# Patient Record
Sex: Female | Born: 1940 | Race: Black or African American | Hispanic: No | Smoking: Former smoker
Health system: Southern US, Community
[De-identification: ages and names within clinical notes are randomized; demographics above are authoritative.]

## PROBLEM LIST (undated history)

## (undated) DIAGNOSIS — I119 Hypertensive heart disease without heart failure: Secondary | ICD-10-CM

## (undated) DIAGNOSIS — R109 Unspecified abdominal pain: Secondary | ICD-10-CM

## (undated) DIAGNOSIS — J301 Allergic rhinitis due to pollen: Secondary | ICD-10-CM

## (undated) DIAGNOSIS — K219 Gastro-esophageal reflux disease without esophagitis: Secondary | ICD-10-CM

## (undated) DIAGNOSIS — M4800 Spinal stenosis, site unspecified: Secondary | ICD-10-CM

## (undated) DIAGNOSIS — I10 Essential (primary) hypertension: Secondary | ICD-10-CM

## (undated) DIAGNOSIS — R51 Headache: Secondary | ICD-10-CM

## (undated) DIAGNOSIS — M779 Enthesopathy, unspecified: Secondary | ICD-10-CM

## (undated) DIAGNOSIS — Z201 Contact with and (suspected) exposure to tuberculosis: Secondary | ICD-10-CM

## (undated) DIAGNOSIS — J45909 Unspecified asthma, uncomplicated: Secondary | ICD-10-CM

## (undated) DIAGNOSIS — M7150 Other bursitis, not elsewhere classified, unspecified site: Secondary | ICD-10-CM

## (undated) DIAGNOSIS — G4730 Sleep apnea, unspecified: Secondary | ICD-10-CM

## (undated) DIAGNOSIS — F419 Anxiety disorder, unspecified: Secondary | ICD-10-CM

## (undated) DIAGNOSIS — M1990 Unspecified osteoarthritis, unspecified site: Secondary | ICD-10-CM

## (undated) DIAGNOSIS — R42 Dizziness and giddiness: Secondary | ICD-10-CM

## (undated) DIAGNOSIS — R159 Full incontinence of feces: Secondary | ICD-10-CM

## (undated) HISTORY — PX: GALLBLADDER SURGERY: SHX652

## (undated) HISTORY — DX: Unspecified abdominal pain: R10.9

## (undated) HISTORY — DX: Hypertensive heart disease without heart failure: I11.9

## (undated) HISTORY — DX: Essential (primary) hypertension: I10

## (undated) HISTORY — DX: Unspecified asthma, uncomplicated: J45.909

## (undated) HISTORY — DX: Enthesopathy, unspecified: M77.9

## (undated) HISTORY — PX: BACK SURGERY: SHX140

## (undated) HISTORY — DX: Full incontinence of feces: R15.9

## (undated) HISTORY — PX: BREAST BIOPSY: SHX20

## (undated) HISTORY — DX: Spinal stenosis, site unspecified: M48.00

## (undated) HISTORY — DX: Dizziness and giddiness: R42

## (undated) HISTORY — PX: CHOLECYSTECTOMY: SHX55

## (undated) HISTORY — DX: Allergic rhinitis due to pollen: J30.1

## (undated) HISTORY — DX: Other bursitis, not elsewhere classified, unspecified site: M71.50

## (undated) HISTORY — DX: Unspecified osteoarthritis, unspecified site: M19.90

## (undated) HISTORY — DX: Contact with and (suspected) exposure to tuberculosis: Z20.1

## (undated) HISTORY — DX: Headache: R51

---

## 1978-11-08 HISTORY — PX: BACK SURGERY: SHX140

## 1988-11-07 HISTORY — PX: GALLBLADDER SURGERY: SHX652

## 1997-05-14 ENCOUNTER — Ambulatory Visit (HOSPITAL_COMMUNITY): Admission: RE | Admit: 1997-05-14 | Discharge: 1997-05-14 | Payer: Self-pay | Admitting: Internal Medicine

## 1997-06-14 ENCOUNTER — Ambulatory Visit (HOSPITAL_COMMUNITY): Admission: RE | Admit: 1997-06-14 | Discharge: 1997-06-14 | Payer: Self-pay | Admitting: Internal Medicine

## 1997-06-15 ENCOUNTER — Ambulatory Visit (HOSPITAL_COMMUNITY): Admission: RE | Admit: 1997-06-15 | Discharge: 1997-06-15 | Payer: Self-pay | Admitting: Infectious Diseases

## 1998-02-12 ENCOUNTER — Ambulatory Visit (HOSPITAL_COMMUNITY): Admission: RE | Admit: 1998-02-12 | Discharge: 1998-02-12 | Payer: Self-pay | Admitting: Internal Medicine

## 1999-12-02 ENCOUNTER — Encounter: Payer: Self-pay | Admitting: Emergency Medicine

## 1999-12-02 ENCOUNTER — Inpatient Hospital Stay (HOSPITAL_COMMUNITY): Admission: EM | Admit: 1999-12-02 | Discharge: 1999-12-06 | Payer: Self-pay | Admitting: Emergency Medicine

## 1999-12-03 ENCOUNTER — Encounter: Payer: Self-pay | Admitting: Cardiovascular Disease

## 1999-12-04 ENCOUNTER — Encounter: Payer: Self-pay | Admitting: Internal Medicine

## 2000-08-06 ENCOUNTER — Ambulatory Visit (HOSPITAL_COMMUNITY): Admission: RE | Admit: 2000-08-06 | Discharge: 2000-08-06 | Payer: Self-pay | Admitting: Gastroenterology

## 2000-08-09 ENCOUNTER — Ambulatory Visit (HOSPITAL_COMMUNITY): Admission: RE | Admit: 2000-08-09 | Discharge: 2000-08-09 | Payer: Self-pay | Admitting: Gastroenterology

## 2000-08-09 ENCOUNTER — Encounter: Payer: Self-pay | Admitting: Gastroenterology

## 2000-09-23 ENCOUNTER — Encounter: Payer: Self-pay | Admitting: General Surgery

## 2000-09-30 ENCOUNTER — Encounter (INDEPENDENT_AMBULATORY_CARE_PROVIDER_SITE_OTHER): Payer: Self-pay | Admitting: *Deleted

## 2000-09-30 ENCOUNTER — Ambulatory Visit (HOSPITAL_COMMUNITY): Admission: RE | Admit: 2000-09-30 | Discharge: 2000-10-01 | Payer: Self-pay | Admitting: General Surgery

## 2001-11-23 ENCOUNTER — Encounter: Admission: RE | Admit: 2001-11-23 | Discharge: 2001-11-23 | Payer: Self-pay | Admitting: Internal Medicine

## 2001-11-23 ENCOUNTER — Encounter: Payer: Self-pay | Admitting: Internal Medicine

## 2003-02-05 ENCOUNTER — Other Ambulatory Visit: Admission: RE | Admit: 2003-02-05 | Discharge: 2003-02-05 | Payer: Self-pay | Admitting: Internal Medicine

## 2003-02-12 ENCOUNTER — Ambulatory Visit (HOSPITAL_COMMUNITY): Admission: RE | Admit: 2003-02-12 | Discharge: 2003-02-12 | Payer: Self-pay | Admitting: Internal Medicine

## 2003-02-16 ENCOUNTER — Ambulatory Visit (HOSPITAL_COMMUNITY): Admission: RE | Admit: 2003-02-16 | Discharge: 2003-02-16 | Payer: Self-pay | Admitting: Internal Medicine

## 2003-03-26 ENCOUNTER — Encounter: Admission: RE | Admit: 2003-03-26 | Discharge: 2003-03-26 | Payer: Self-pay | Admitting: Internal Medicine

## 2003-04-26 ENCOUNTER — Inpatient Hospital Stay (HOSPITAL_BASED_OUTPATIENT_CLINIC_OR_DEPARTMENT_OTHER): Admission: RE | Admit: 2003-04-26 | Discharge: 2003-04-26 | Payer: Self-pay | Admitting: Cardiology

## 2004-08-29 ENCOUNTER — Encounter (INDEPENDENT_AMBULATORY_CARE_PROVIDER_SITE_OTHER): Payer: Self-pay | Admitting: *Deleted

## 2004-08-29 ENCOUNTER — Ambulatory Visit (HOSPITAL_COMMUNITY): Admission: RE | Admit: 2004-08-29 | Discharge: 2004-08-29 | Payer: Self-pay | Admitting: Gastroenterology

## 2005-05-23 ENCOUNTER — Ambulatory Visit (HOSPITAL_BASED_OUTPATIENT_CLINIC_OR_DEPARTMENT_OTHER): Admission: RE | Admit: 2005-05-23 | Discharge: 2005-05-23 | Payer: Self-pay | Admitting: Internal Medicine

## 2005-05-31 ENCOUNTER — Ambulatory Visit: Payer: Self-pay | Admitting: Internal Medicine

## 2005-07-09 ENCOUNTER — Ambulatory Visit: Payer: Self-pay | Admitting: Internal Medicine

## 2005-08-13 ENCOUNTER — Ambulatory Visit: Payer: Self-pay | Admitting: Internal Medicine

## 2006-10-31 ENCOUNTER — Emergency Department (HOSPITAL_COMMUNITY): Admission: EM | Admit: 2006-10-31 | Discharge: 2006-10-31 | Payer: Self-pay | Admitting: Emergency Medicine

## 2007-01-26 ENCOUNTER — Ambulatory Visit (HOSPITAL_COMMUNITY): Admission: RE | Admit: 2007-01-26 | Discharge: 2007-01-26 | Payer: Self-pay | Admitting: Internal Medicine

## 2007-08-26 ENCOUNTER — Ambulatory Visit (HOSPITAL_COMMUNITY): Admission: RE | Admit: 2007-08-26 | Discharge: 2007-08-26 | Payer: Self-pay | Admitting: Internal Medicine

## 2008-01-27 ENCOUNTER — Ambulatory Visit (HOSPITAL_COMMUNITY): Admission: RE | Admit: 2008-01-27 | Discharge: 2008-01-27 | Payer: Self-pay | Admitting: Internal Medicine

## 2008-02-10 ENCOUNTER — Other Ambulatory Visit: Admission: RE | Admit: 2008-02-10 | Discharge: 2008-02-10 | Payer: Self-pay | Admitting: Internal Medicine

## 2008-12-24 ENCOUNTER — Inpatient Hospital Stay (HOSPITAL_COMMUNITY): Admission: EM | Admit: 2008-12-24 | Discharge: 2009-01-04 | Payer: Self-pay | Admitting: Emergency Medicine

## 2008-12-25 ENCOUNTER — Ambulatory Visit: Payer: Self-pay | Admitting: Vascular Surgery

## 2008-12-25 ENCOUNTER — Encounter: Payer: Self-pay | Admitting: Internal Medicine

## 2009-01-02 ENCOUNTER — Encounter: Payer: Self-pay | Admitting: Internal Medicine

## 2009-01-19 ENCOUNTER — Emergency Department (HOSPITAL_COMMUNITY): Admission: EM | Admit: 2009-01-19 | Discharge: 2009-01-19 | Payer: Self-pay | Admitting: Emergency Medicine

## 2009-01-21 ENCOUNTER — Encounter: Admission: RE | Admit: 2009-01-21 | Discharge: 2009-01-21 | Payer: Self-pay | Admitting: Emergency Medicine

## 2009-02-08 ENCOUNTER — Encounter (HOSPITAL_COMMUNITY): Admission: RE | Admit: 2009-02-08 | Discharge: 2009-03-07 | Payer: Self-pay | Admitting: Cardiology

## 2009-04-17 ENCOUNTER — Encounter: Admission: RE | Admit: 2009-04-17 | Discharge: 2009-06-26 | Payer: Self-pay | Admitting: Internal Medicine

## 2010-03-09 HISTORY — PX: CARDIAC CATHETERIZATION: SHX172

## 2010-06-12 LAB — BASIC METABOLIC PANEL WITH GFR
BUN: 4 mg/dL — ABNORMAL LOW (ref 6–23)
CO2: 31 meq/L (ref 19–32)
Calcium: 9.3 mg/dL (ref 8.4–10.5)
Chloride: 100 meq/L (ref 96–112)
Creatinine, Ser: 0.87 mg/dL (ref 0.4–1.2)
GFR calc non Af Amer: >60 mL/min
Glucose, Bld: 96 mg/dL (ref 70–99)
Potassium: 3.5 meq/L (ref 3.5–5.1)
Sodium: 138 meq/L (ref 135–145)

## 2010-06-12 LAB — URINALYSIS, ROUTINE W REFLEX MICROSCOPIC
Bilirubin Urine: NEGATIVE
Glucose, UA: NEGATIVE mg/dL
Hgb urine dipstick: NEGATIVE
Ketones, ur: NEGATIVE mg/dL
Nitrite: NEGATIVE
Protein, ur: NEGATIVE mg/dL
Specific Gravity, Urine: 1.005 (ref 1.005–1.030)
Urobilinogen, UA: 0.2 mg/dL (ref 0.0–1.0)
pH: 7.5 (ref 5.0–8.0)

## 2010-06-12 LAB — CBC
HCT: 35 % — ABNORMAL LOW (ref 36.0–46.0)
HCT: 35.2 % — ABNORMAL LOW (ref 36.0–46.0)
HCT: 39.4 % (ref 36.0–46.0)
HCT: 44.6 % (ref 36.0–46.0)
Hemoglobin: 11.8 g/dL — ABNORMAL LOW (ref 12.0–15.0)
Hemoglobin: 11.9 g/dL — ABNORMAL LOW (ref 12.0–15.0)
Hemoglobin: 13.4 g/dL (ref 12.0–15.0)
Hemoglobin: 15.2 g/dL — ABNORMAL HIGH (ref 12.0–15.0)
MCHC: 33.8 g/dL (ref 30.0–36.0)
MCHC: 33.8 g/dL (ref 30.0–36.0)
MCHC: 34 g/dL (ref 30.0–36.0)
MCHC: 34.1 g/dL (ref 30.0–36.0)
MCV: 95 fL (ref 78.0–100.0)
MCV: 95.7 fL (ref 78.0–100.0)
MCV: 96.1 fL (ref 78.0–100.0)
MCV: 96.3 fL (ref 78.0–100.0)
Platelets: 227 K/uL (ref 150–400)
Platelets: 244 10*3/uL (ref 150–400)
Platelets: 248 K/uL (ref 150–400)
Platelets: 280 10*3/uL (ref 150–400)
RBC: 3.64 MIL/uL — ABNORMAL LOW (ref 3.87–5.11)
RBC: 3.65 MIL/uL — ABNORMAL LOW (ref 3.87–5.11)
RBC: 4.12 MIL/uL (ref 3.87–5.11)
RBC: 4.7 MIL/uL (ref 3.87–5.11)
RDW: 13 % (ref 11.5–15.5)
RDW: 13 % (ref 11.5–15.5)
RDW: 13.1 % (ref 11.5–15.5)
RDW: 13.3 % (ref 11.5–15.5)
WBC: 10.4 K/uL (ref 4.0–10.5)
WBC: 6.9 K/uL (ref 4.0–10.5)
WBC: 7.2 10*3/uL (ref 4.0–10.5)
WBC: 7.7 10*3/uL (ref 4.0–10.5)
WBC: 8.6 10*3/uL (ref 4.0–10.5)

## 2010-06-12 LAB — COMPREHENSIVE METABOLIC PANEL
ALT: 10 U/L (ref 0–35)
ALT: 9 U/L (ref 0–35)
AST: 13 U/L (ref 0–37)
AST: 17 U/L (ref 0–37)
AST: 17 U/L (ref 0–37)
Albumin: 3.4 g/dL — ABNORMAL LOW (ref 3.5–5.2)
Albumin: 3.9 g/dL (ref 3.5–5.2)
Alkaline Phosphatase: 102 U/L (ref 39–117)
Alkaline Phosphatase: 105 U/L (ref 39–117)
Alkaline Phosphatase: 88 U/L (ref 39–117)
BUN: 5 mg/dL — ABNORMAL LOW (ref 6–23)
BUN: 8 mg/dL (ref 6–23)
CO2: 29 mEq/L (ref 19–32)
CO2: 30 mEq/L (ref 19–32)
CO2: 32 mEq/L (ref 19–32)
Calcium: 10.1 mg/dL (ref 8.4–10.5)
Calcium: 9 mg/dL (ref 8.4–10.5)
Chloride: 101 mEq/L (ref 96–112)
Chloride: 98 mEq/L (ref 96–112)
Chloride: 98 mEq/L (ref 96–112)
Creatinine, Ser: 0.8 mg/dL (ref 0.4–1.2)
GFR calc Af Amer: >60 mL/min (ref >60)
GFR calc Af Amer: >60 mL/min (ref >60)
GFR calc non Af Amer: >60 mL/min (ref >60)
GFR calc non Af Amer: >60 mL/min (ref >60)
Glucose, Bld: 102 mg/dL — ABNORMAL HIGH (ref 70–99)
Glucose, Bld: 98 mg/dL (ref 70–99)
Potassium: 3 mEq/L — ABNORMAL LOW (ref 3.5–5.1)
Potassium: 4.1 mEq/L (ref 3.5–5.1)
Sodium: 137 mEq/L (ref 135–145)
Sodium: 137 mEq/L (ref 135–145)
Total Bilirubin: 0.7 mg/dL (ref 0.3–1.2)
Total Bilirubin: 0.7 mg/dL (ref 0.3–1.2)
Total Bilirubin: 0.7 mg/dL (ref 0.3–1.2)
Total Protein: 7.3 g/dL (ref 6.0–8.3)

## 2010-06-12 LAB — BLOOD GAS, ARTERIAL
Acid-Base Excess: 0.5 mmol/L (ref 0.0–2.0)
Acid-Base Excess: 3.4 mmol/L — ABNORMAL HIGH (ref 0.0–2.0)
Bicarbonate: 32.2 mEq/L — ABNORMAL HIGH (ref 20.0–24.0)
Bicarbonate: 34.6 meq/L — ABNORMAL HIGH (ref 20.0–24.0)
Drawn by: 310571
FIO2: 1 %
O2 Saturation: 92.1 %
O2 Saturation: 93.1 %
Patient temperature: 98.6
TCO2: 33.8 mmol/L (ref 0–100)
pCO2 arterial: 120 mmHg (ref 35.0–45.0)
pH, Arterial: 7.086 — CL (ref 7.350–7.400)
pO2, Arterial: 72.1 mmHg — ABNORMAL LOW (ref 80.0–100.0)
pO2, Arterial: 82.6 mmHg (ref 80.0–100.0)

## 2010-06-12 LAB — DIFFERENTIAL
Basophils Absolute: 0 10*3/uL (ref 0.0–0.1)
Basophils Absolute: 0 K/uL (ref 0.0–0.1)
Basophils Relative: 0 % (ref 0–1)
Basophils Relative: 0 % (ref 0–1)
Eosinophils Absolute: 0.1 10*3/uL (ref 0.0–0.7)
Eosinophils Absolute: 0.2 K/uL (ref 0.0–0.7)
Eosinophils Relative: 2 % (ref 0–5)
Eosinophils Relative: 3 % (ref 0–5)
Lymphocytes Relative: 35 % (ref 12–46)
Lymphocytes Relative: 38 % (ref 12–46)
Lymphs Abs: 2.5 K/uL (ref 0.7–4.0)
Lymphs Abs: 3.2 10*3/uL (ref 0.7–4.0)
Monocytes Absolute: 0.5 10*3/uL (ref 0.1–1.0)
Monocytes Absolute: 0.9 K/uL (ref 0.1–1.0)
Monocytes Relative: 12 % (ref 3–12)
Monocytes Relative: 6 % (ref 3–12)
Neutro Abs: 3.6 K/uL (ref 1.7–7.7)
Neutro Abs: 4.7 10*3/uL (ref 1.7–7.7)
Neutrophils Relative %: 50 % (ref 43–77)
Neutrophils Relative %: 54 % (ref 43–77)

## 2010-06-12 LAB — URINE MICROSCOPIC-ADD ON

## 2010-06-12 LAB — COMPREHENSIVE METABOLIC PANEL WITH GFR
ALT: 10 U/L (ref 0–35)
ALT: 10 U/L (ref 0–35)
ALT: 13 U/L (ref 0–35)
ALT: 19 U/L (ref 0–35)
ALT: 9 U/L (ref 0–35)
AST: 13 U/L (ref 0–37)
AST: 16 U/L (ref 0–37)
AST: 20 U/L (ref 0–37)
AST: 28 U/L (ref 0–37)
AST: 44 U/L — ABNORMAL HIGH (ref 0–37)
Albumin: 2.8 g/dL — ABNORMAL LOW (ref 3.5–5.2)
Albumin: 2.8 g/dL — ABNORMAL LOW (ref 3.5–5.2)
Albumin: 2.9 g/dL — ABNORMAL LOW (ref 3.5–5.2)
Albumin: 3 g/dL — ABNORMAL LOW (ref 3.5–5.2)
Albumin: 3.1 g/dL — ABNORMAL LOW (ref 3.5–5.2)
Alkaline Phosphatase: 73 U/L (ref 39–117)
Alkaline Phosphatase: 78 U/L (ref 39–117)
Alkaline Phosphatase: 79 U/L (ref 39–117)
Alkaline Phosphatase: 83 U/L (ref 39–117)
Alkaline Phosphatase: 90 U/L (ref 39–117)
BUN: 2 mg/dL — ABNORMAL LOW (ref 6–23)
BUN: 4 mg/dL — ABNORMAL LOW (ref 6–23)
BUN: 4 mg/dL — ABNORMAL LOW (ref 6–23)
BUN: 5 mg/dL — ABNORMAL LOW (ref 6–23)
BUN: 5 mg/dL — ABNORMAL LOW (ref 6–23)
CO2: 31 meq/L (ref 19–32)
CO2: 32 meq/L (ref 19–32)
CO2: 32 meq/L (ref 19–32)
CO2: 33 meq/L — ABNORMAL HIGH (ref 19–32)
CO2: 33 meq/L — ABNORMAL HIGH (ref 19–32)
Calcium: 9 mg/dL (ref 8.4–10.5)
Calcium: 9 mg/dL (ref 8.4–10.5)
Calcium: 9.2 mg/dL (ref 8.4–10.5)
Calcium: 9.2 mg/dL (ref 8.4–10.5)
Calcium: 9.2 mg/dL (ref 8.4–10.5)
Chloride: 100 meq/L (ref 96–112)
Chloride: 95 meq/L — ABNORMAL LOW (ref 96–112)
Chloride: 98 meq/L (ref 96–112)
Chloride: 99 meq/L (ref 96–112)
Chloride: 99 meq/L (ref 96–112)
Creatinine, Ser: 0.53 mg/dL (ref 0.4–1.2)
Creatinine, Ser: 0.58 mg/dL (ref 0.4–1.2)
Creatinine, Ser: 0.65 mg/dL (ref 0.4–1.2)
Creatinine, Ser: 0.82 mg/dL (ref 0.4–1.2)
Creatinine, Ser: 0.85 mg/dL (ref 0.4–1.2)
GFR calc non Af Amer: >60 mL/min
GFR calc non Af Amer: >60 mL/min
GFR calc non Af Amer: >60 mL/min
GFR calc non Af Amer: >60 mL/min
GFR calc non Af Amer: >60 mL/min
Glucose, Bld: 104 mg/dL — ABNORMAL HIGH (ref 70–99)
Glucose, Bld: 118 mg/dL — ABNORMAL HIGH (ref 70–99)
Glucose, Bld: 122 mg/dL — ABNORMAL HIGH (ref 70–99)
Glucose, Bld: 132 mg/dL — ABNORMAL HIGH (ref 70–99)
Glucose, Bld: 93 mg/dL (ref 70–99)
Potassium: 3.8 meq/L (ref 3.5–5.1)
Potassium: 3.8 meq/L (ref 3.5–5.1)
Potassium: 3.8 meq/L (ref 3.5–5.1)
Potassium: 4.4 meq/L (ref 3.5–5.1)
Potassium: 4.4 meq/L (ref 3.5–5.1)
Sodium: 135 meq/L (ref 135–145)
Sodium: 136 meq/L (ref 135–145)
Sodium: 137 meq/L (ref 135–145)
Sodium: 137 meq/L (ref 135–145)
Sodium: 137 meq/L (ref 135–145)
Total Bilirubin: 0.6 mg/dL (ref 0.3–1.2)
Total Bilirubin: 0.7 mg/dL (ref 0.3–1.2)
Total Bilirubin: 0.8 mg/dL (ref 0.3–1.2)
Total Bilirubin: 0.8 mg/dL (ref 0.3–1.2)
Total Bilirubin: 0.9 mg/dL (ref 0.3–1.2)
Total Protein: 5.8 g/dL — ABNORMAL LOW (ref 6.0–8.3)
Total Protein: 5.8 g/dL — ABNORMAL LOW (ref 6.0–8.3)
Total Protein: 5.9 g/dL — ABNORMAL LOW (ref 6.0–8.3)
Total Protein: 5.9 g/dL — ABNORMAL LOW (ref 6.0–8.3)
Total Protein: 6.1 g/dL (ref 6.0–8.3)

## 2010-06-12 LAB — FECAL LACTOFERRIN, QUANT: Fecal Lactoferrin: POSITIVE

## 2010-06-12 LAB — ELECTROLYTE PANEL
CO2: 33 meq/L — ABNORMAL HIGH (ref 19–32)
Chloride: 98 meq/L (ref 96–112)
Potassium: 3.9 meq/L (ref 3.5–5.1)
Sodium: 137 meq/L (ref 135–145)

## 2010-06-12 LAB — CK TOTAL AND CKMB (NOT AT ARMC)
CK, MB: 1.2 ng/mL (ref 0.3–4.0)
CK, MB: 2.2 ng/mL (ref 0.3–4.0)
CK, MB: 2.2 ng/mL (ref 0.3–4.0)
CK, MB: 3.2 ng/mL (ref 0.3–4.0)
Relative Index: 1.5 (ref 0.0–2.5)
Relative Index: INVALID (ref 0.0–2.5)
Relative Index: INVALID (ref 0.0–2.5)
Relative Index: INVALID (ref 0.0–2.5)
Total CK: 215 U/L — ABNORMAL HIGH (ref 7–177)
Total CK: 70 U/L (ref 7–177)
Total CK: 91 U/L (ref 7–177)
Total CK: 92 U/L (ref 7–177)

## 2010-06-12 LAB — GLUCOSE, CAPILLARY: Glucose-Capillary: 187 mg/dL — ABNORMAL HIGH (ref 70–99)

## 2010-06-12 LAB — URINE CULTURE: Colony Count: 30000

## 2010-06-12 LAB — OVA AND PARASITE EXAMINATION: Ova and parasites: NONE SEEN

## 2010-06-12 LAB — D-DIMER, QUANTITATIVE

## 2010-06-12 LAB — TROPONIN I: Troponin I: 0.04 ng/mL (ref 0.00–0.06)

## 2010-06-12 LAB — CARDIAC PANEL(CRET KIN+CKTOT+MB+TROPI)
CK, MB: 1 ng/mL (ref 0.3–4.0)
CK, MB: 1.1 ng/mL (ref 0.3–4.0)
Relative Index: INVALID (ref 0.0–2.5)
Total CK: 35 U/L (ref 7–177)
Troponin I: 0.01 ng/mL (ref 0.00–0.06)

## 2010-06-12 LAB — HEMOCCULT GUIAC POC 1CARD (OFFICE): Fecal Occult Bld: NEGATIVE

## 2010-06-12 LAB — CK: Total CK: 84 U/L (ref 7–177)

## 2010-06-12 LAB — BRAIN NATRIURETIC PEPTIDE: Pro B Natriuretic peptide (BNP): <30 pg/mL (ref 0.0–100.0)

## 2010-06-12 LAB — AMYLASE
Amylase: 33 U/L (ref 27–131)
Amylase: 52 U/L (ref 27–131)

## 2010-06-12 LAB — STOOL CULTURE

## 2010-06-26 ENCOUNTER — Encounter: Payer: Self-pay | Admitting: Internal Medicine

## 2010-07-25 NOTE — Op Note (Signed)
NAME:  Charlotte Whitehead, Charlotte Whitehead                 ACCOUNT NO.:  [id number]   MEDICAL RECORD NO.:  [id number]          PATIENT TYPE:  AMB   LOCATION:  ENDO                         FACILITY:  [facility]   PHYSICIAN:  Anselmo Rod, M.D.  DATE OF BIRTH:  10-05-1940   DATE OF PROCEDURE:  08/29/2004  DATE OF DISCHARGE:                                 OPERATIVE REPORT   PROCEDURE:  Colonoscopy with multiple biopsies.   ENDOSCOPIST:  Charna Elizabeth, M.D.   INSTRUMENT USED:  Olympus video colonoscope.   INDICATIONS FOR PROCEDURE:  70 year old African American female undergoing  screening colonoscopy to rule out colonic polyps, masses, etc.   PREPROCEDURE PREPARATION:  Informed consent was obtained from the patient.  The patient was fasted for eight hours prior to the procedure and prepped  with a bottle of magnesium citrate and a gallon of GoLYTELY the night prior  to the procedure.  The risks and benefits of the procedure including a 10%  missed rate of cancer and polyps were discussed with the patient, as well.   PREPROCEDURE PHYSICAL:  Patient with stable vital signs.  Neck supple.  Chest clear to auscultation.  S1 and S2 regular.  Abdomen soft with normal  bowel sounds.   DESCRIPTION OF PROCEDURE:  The patient was placed in the left lateral  decubitus position, sedated with 100 mg of Demerol and 10 mg Versed in slow  incremental doses.  Once the patient was adequately sedated, maintained on  low flow oxygen and continuous cardiac monitoring, the Olympus video  colonoscope was advanced from the rectum to the cecum.  Multiple washes were  done as there was a significant amount of residual stool in the colon.  A  small sessile biopsy was biopsied in the rectosigmoid colon (cold biopsies x  2).  A few sigmoid diverticula were seen.  Three small sessile polyps were  biopsied from the rectosigmoid colon.  Retroflexion in the rectum revealed  no abnormalities.  The patient tolerated the procedure well  without  complications.   IMPRESSION:  1.  Three small sessile polyps biopsied from the rectosigmoid colon.  2.  One small sessile polyp biopsied from 80 cm.  3.  A few sigmoid diverticula.  4.  Some residual stool in the colon, multiple washes done.   RECOMMENDATIONS:  1.  Await pathology results.  2.  Continue a high fiber diet with liberal fluid intake.  3.  Repeat colonoscopy depending on pathology results.  4.  Avoid all nonsteroidals for now.  5.  Outpatient follow up as need arises in the future.       JNM/MEDQ  D:  08/29/2004  T:  08/29/2004  Job:  [id number]   cc:   Minerva Areola L. August Saucer, M.D.  [address] [address]  [place]  [place] [address]  Fax: [phone]

## 2010-07-25 NOTE — Discharge Summary (Signed)
[place]. [facility]  Patient:    Charlotte Whitehead, Charlotte Whitehead                        MRN: [id number] Adm. Date:  [id number] Disc. Date: [id number] Attending:  Gwenyth Bender                           Discharge Summary  FINAL DIAGNOSES: 1. Acute asthma, 493.00. 2. Chest pain, 786.59. 3. Hypokalemia, 276.8. 4. Acute sinusitis, 461.9. 5. Hypertension, 401.9. 6. History of tobacco use, V158.82.  OPERATIONS AND PROCEDURES:  Stress Cardiolite per Ricki Rodriguez, M.D.  HISTORY OF PRESENT ILLNESS:  This was the first recent [place]. [facility] admission for this 70 year old black female who presented to the office complaining of increasing shortness of breath with cough.  The patient had been feeling well up until four days prior to admission when she noted the onset of increasing sinus congestion with cough and postnasal drainage.  The patient gradually developed a cough productive of yellowish to brownish phlegm.  There had been intermittent reddish sputum production as well.  The patient took an old Allegra at home as well as an antibiotic without significant relief.  On the day of admission she developed increasing substernal pressure pain while at rest with mild shortness of breath, no diaphoresis, nausea or vomiting.  No arm symptoms.  The patients symptoms persisted and she came to the office for further evaluation and was admitted thereafter.  Past medical history and physical examination is per admission H&P.  HOSPITAL COURSE:  The patient was admitted fracture further evaluation of atypical chest pain and shortness of breath.  There was a real concern for possible underlying angina.  EKG in the office was notable for normal sinus rhythm with T-wave inversions in V1 through V3 which was new from a previous tracing.  The patient was admitted to telemetry.  She was placed on IV nitroglycerin and IV heparin.  She was also started on Tequin at 400 mg  IV followed by 400 mg p.o.  She was noted to have some bronchospasm as well and was given an inhaler for this with expectorants.  Over the first 24 hours, the patient did considerably better.  Her enzymes were negative for signs of ischemia.  She was seen in consultation by Dr. Algie Coffer.  It was felt that her symptoms were suggestive and cardiac evaluation was needed.  Notably, potassium was low at the time of admission as well and this was replaced first.  She subsequently underwent stress Cardiolite study which was negative for ischemia.  The patient, thereafter, continued to make steady progress.  She was noted to have acute bronchitis clinically and this was treated.  On repeated examinations she was noted to have sinus tenderness. A CT scan of the sinuses subsequently was obtained.  This showed evidence for bilateral maxillary sinus changes as well as ethmoid disease as well.  Further treatment of her sinuses, allergies and asthma was pursued thereafter.  She subsequently made steady progress.  By December 06, 1999, she was felt to be stable for discharge.  The patient was ambulatory without significant chest pain.  MEDICATIONS AT TIME OF DISCHARGE: 1. Advair Diskus 250/50 one puff b.i.d. 2. Flexeril 10 mg t.i.d. p.r.n. 3. Vioxx 25 mg p.o. q.d. for two weeks. 4. Allegra 180 mg p.o. q.d. 5. K-Dur 20 mEq b.i.d. 6. Tequin 400 mg q.d. for  five days. 7. Prevacid 30 mg p.o. q.h.s. 8. Darvocet-N 100 one p.o. q.4h. p.r.n. severe pain.  DIET:  The patient will be maintained on a 4 g sodium diet.  DISCHARGE INSTRUCTIONS:  She is to measure her peak flows daily, restart Maxzide 25 mg half tablet q.d. on Sunday.  She is to have her blood pressure checked in the office in five days time and follow-up appointment as well. DD:  01/14/00 TD:  01/15/00 Job: [id number]

## 2010-07-25 NOTE — Op Note (Signed)
[place]. [facility]  Patient:    Charlotte Whitehead, Charlotte Whitehead                        MRN: [id number] Proc. Date: 09/30/00 Adm. Date:  [id number] Disc. Date: [id number] Attending:  Charna Elizabeth                           Operative Report  PREOPERATIVE DIAGNOSIS:  Symptomatic cholelithiasis.  POSTOPERATIVE DIAGNOSIS:  Symptomatic cholelithiasis.  OPERATION PERFORMED:  Laparoscopic cholecystectomy.  SURGEON:  Jimmye Norman, M.D.  ASSISTANT:  Chevis Pretty, M.D.  ANESTHESIA:  General endotracheal.  ESTIMATED BLOOD LOSS:  Less than 20 cc.  COMPLICATIONS:  None.  CONDITION:  Stable.  SPECIMENS:  Gallbladder with stones.  OPERATIVE FINDINGS:  Mildly subacute gallbladder with a large number of 1 to 2 cm stones contained within.  There was no abnormal anatomy.  INDICATIONS FOR PROCEDURE:  The patient is a 70 year old female with abdominal pain for the last several weeks and actually years, probably symptomatic for over a year who now comes in for an elective laparoscopic cholecystectomy.  DESCRIPTION OF PROCEDURE:  The patient was taken to the operating room and placed on the table in supine position.  After an adequate endotracheal anesthetic was administered, she was prepped and draped in the usual sterile manner exposing the midline of the abdomen.  A superumbilical curvilinear incision was made above the umbilicus down to the midline fascia with the patient in reverse Trendelenburg position position. It was through this midline fascia that a Veress needle was passed into the peritoneal cavity and confirmed to be in adequate position using the saline test.  Once this was done, we passed CO2 gas through the Veress needle into the peritoneal cavity up to a maximal intra-abdominal pressure of 15 mmHg. With this being done, an 11-12 mm bladed trocar and cannula was passed through the superumbilical fascia into the peritoneal cavity and confirmed to be in adequate position  using the laparoscope with attached camera and light source. With that cannula in place and the light source in place, and camera viewing the intra-abdominal contents, we were able to pass two right costal margin 5 mm cannulas and a subxiphoid 10-11 cannula under direct vision into the peritoneal cavity.  We then placed the patient in steeper reversed Trendelenburg position.  Her left side was tilted down and we began the dissection of the gallbladder.  The gallbladder was markedly distended and she had somewhat of a hypertrophic left lobe of the liver which did somewhat obscure the hepatobiliary triangle and the triangle of Calot.  However, we were able to identify the cystic duct and the cystic artery after dissecting out the peritoneum overlying those areas.  The cystic duct and cystic artery were both proximally and distally doubly clipped and then transected.  We were able to dissect up the gallbladder from its bed with minimal difficulty and without entering into the gallbladder itself.  The only time we had to open the gallbladder was when we got it to the fascial level at the periumbilical site as we were retrieving it and the large stones prohibited passage through the small incision.  We removed over 30 stones and then subsequently we removed the gallbladder without difficulty.  We inspected the gallbladder bed and the liver and found there to be minimal to no bleeding.  We did irrigate and cauterize appropriately.  Once this was done  and the gallbladder was out, we irrigated the periumbilical site because of possible translocation of some debris from the gallbladder into the wound.  Once this was done, we reapproximated the midline fascia at the umbilicus using a figure-of-eight stitch of 90 Vicryl and then the skin of all incision sites were closed using running subcuticular 4-0 Vicryl.  0.25% Marcaine with epinephrine was injected at all sites and then subsequently sterile  dressings were applied.  Sponge, needle and instrument counts were correct at the conclusion of the case. DD:  09/30/00 TD:  09/30/00 Job: [id number]

## 2010-07-25 NOTE — Cardiovascular Report (Signed)
NAME:  Charlotte Whitehead, Charlotte Whitehead                           ACCOUNT NO.:  [id number]   MEDICAL RECORD NO.:  [id number]                   PATIENT TYPE:  AMB   LOCATION:  NA                                   FACILITY:  [facility]   PHYSICIAN:  Mohan N. Sharyn Lull, M.D.              DATE OF BIRTH:  08-Jan-1941   DATE OF PROCEDURE:  04/26/2003  DATE OF DISCHARGE:                              CARDIAC CATHETERIZATION   PROCEDURE:  Left cardiac catheterization with selective left and right  coronary angiography and left ventriculography via the right groin using  Judkins technique.   INDICATIONS:  Charlotte Whitehead is a 70 year old black female with past medical  history significant for hypertension, history of bronchial asthma, history  of allergic rhinitis, history of tobacco abuse, who complains of  retrosternal chest pain described as tightness associated with tingling in  the left arm lasting for a few minutes.  Chest pain is grade 3/10 without  associated nausea, vomiting or diaphoresis.  Denies any neck or head trauma.  Denies palpitation, lightheadedness or syncope.  Also, complains of  exertional dyspnea associated with minimal exertion. She had stress test  three or four years ago which was normal.  EKG done in the office showed  normal sinus rhythm with 5-6 T-waves in anteroseptal leads.   PAST MEDICAL HISTORY:  As above.   PAST SURGICAL HISTORY:  She had back surgery for herniated disk 30+ years  ago, had cholecystectomy approximately two years ago.   ALLERGIES:  No known drug allergies.   MEDICATIONS:  1. Triamterene HCT 37.5/25 once daily.  2. Diltiazem 240 mg p.o. daily.  3. Nexium 40 mg p.o. daily.  4. Baby aspirin 81 mg p.o. daily.  5. Singulair 10 mg p.o. daily.  6. Advair 250/50 b.i.d.  7. Allegra 180 p.o. daily.  8. Ambien 5 mg p.o. h.s. p.r.n.  9. Albuterol inhaler p.r.n.   SOCIAL HISTORY:  She is divorce and has one child.  Smoked two packs per day  for 20-25 years.  Quit four years  ago.  No history of alcohol abuse.  Works  at [employer] office at [employer].   FAMILY HISTORY:  Father died of liver problems at age of 52.  Mother died of  stroke at age of 87.  One sister is hypertensive and diabetic.  One brother  is hypertensive and diabetic.   PHYSICAL EXAMINATION:  GENERAL:  She was alert, awake, oriented x3 in no  acute distress.  VITAL SIGNS:  Blood pressure 140/80, pulse 66 and regular.  HEENT:  Conjunctivae pink.  NECK:  Supple.  No jugular venous distention.  No bruit.  LUNGS:  Clear to auscultation without rhonchi or rales or wheezing.  CARDIOVASCULAR:  S1, S2 was normal.  There was no S3 or S4 gallop.  There  was no murmur.  ABDOMEN:  Soft.  Obese.  Bowel sounds are present.  Nontender.  EXTREMITIES:  No clubbing, cyanosis or edema.   EKG showed normal sinus rhythm with biphasic T-wave in anteroseptal leads.  Discussed with the patient regarding noninvasive stress testing versus left  cath, its risks and benefits; i.e., death, myocardial infarction, stroke,  need for emergency coronary artery bypass graft, local vascular  complications, etc., and consented for invasive procedure.   PROCEDURE:  After obtaining informed consent, the patient was brought to the  cath lab and was placed on fluoroscopy table. Right groin was prepped and  draped in usual fashion.  2% Xylocaine was used for local anesthesia in the  right groin.  With the help of thin-wall needle, a 4-French arterial sheath  was placed.  The sheath was aspirated and flushed.  Next, 4-French left  Judkins catheter was advanced over the wire under fluoroscopic guidance up  to the ascending aorta.  Wire was pulled out and the catheter was aspirated  and connected to the manifold. Catheter was further advanced and engaged  into left coronary ostium.  Multiple views of the left system were taken.  Next, the catheter was disengaged and was pulled out over the wire and was  replaced with  4-French right Judkins catheter which was advanced over the  wire under fluoroscopic guidance to the ascending aorta.  Wire was pulled  out, the catheter was aspirated and connected to the manifold.  Catheter was  further advanced and engaged into right coronary ostium.  Multiple views of  the right system were taken.  Next, the catheter was disengaged and was  pulled out over the wire and was replaced with 4-French pigtail catheter  which was advanced over wire under fluoroscopic guidance up to the ascending  aorta.  Wire was pulled out, the catheter was aspirated and connected to the  manifold.  Catheter was further advanced across the aortic valve into the  left ventricle.  Left ventricular pressures were recorded.  Next, left  ventriculography was done in 30-degree RAO position.  Post angiographic  pressures were recorded from LV and then pullback pressures were recorded  from the aorta.  There was no gradient across the aortic valve.  Next, the  pigtail catheter was pulled out over the wire, sheaths aspirated and  flushed.   FINDINGS:  The LV showed good LV systolic function, EF of 55-60%.  Left main  was patent.  LAD has 10-15% mid stenosis.  Diagonal one was small which was  patent.  Diagonal two was moderate size with 10-15% proximal stenosis.  Diagonal three was very, very small.  Left circumflex was patent.  OM-1 was  moderate size which was patent.  Dara Lords was patent.  The patient tolerated  the procedure well.  There were no complications.  The patient was  transferred to recovery room in stable condition.  The patient will be  continued on same medications and will be discharged home this afternoon.                                               Eduardo Osier. Sharyn Lull, M.D.    MNH/MEDQ  D:  04/26/2003  T:  04/26/2003  Job:  [id number]   cc:   Minerva Areola L. August Saucer, M.D.  [address] [address]  [place]  [place] [address]  Fax: [phone]

## 2010-07-25 NOTE — Procedures (Signed)
NAME:  Charlotte Whitehead, Charlotte Whitehead                 ACCOUNT NO.:  [id number]   MEDICAL RECORD NO.:  [id number]          PATIENT TYPE:  OUT   LOCATION:  SLEEP CENTER                 FACILITY:  [facility]   PHYSICIAN:  Clinton D. Maple Hudson, M.D. DATE OF BIRTH:  [birth year]   DATE OF STUDY:  05/23/2005                              NOCTURNAL POLYSOMNOGRAM   REFERRING PHYSICIAN:  Dr. Willey Blade.   DATE OF STUDY:  May 23, 2005.   INDICATION FOR STUDY:  Hypersomnia with sleep apnea.   EPWORTH SLEEPINESS SCORE:  6/24.   BMI:  29.8.   WEIGHT:  175 pounds.   HOME MEDICATIONS:  Triamterene/HCTZ, Celebrex, Nexium, amitriptyline,  diltiazem, cyclobenzaprine, Advair, Singulair, Allegra.   SLEEP ARCHITECTURE:  Total sleep time 338 minutes with sleep efficiency 75%.  Stage I was 24%, stage II 63%, stages III and IV 4%, REM 8% of total sleep  time.  Sleep latency 72 minutes, REM latency 175 minutes, awake after sleep  onset 41 minutes, arousal index increased at 52.7 indicating fragmentation.  She had taken Ambien and amitriptyline at bedtime.   RESPIRATORY DATA:  Split study protocol:  Apnea/hypopnea index (AHI, RDI)  63.1 obstructive events per hour indicating severe obstructive sleep  apnea/hypopnea syndrome before C-PAP.  This included 2 obstructive apneas  and 139 hypopneas before C-PAP.  Events were nonpositional and most severe  while lying on right side.  REM AHI 4.4 per hour.  C-PAP was titrated to 12  CWP, AHI 0.7 per hour.  A small Respironics ComfortGel nasal mask was used  with heated humidifier.  The technician added a chin strap at higher  pressures because of oral venting and suggested that the patient may  eventually benefit from a full-face mask.   OXYGEN DATA:  Moderate snoring with oxygen desaturation to a nadir of 91%.  Oxygen saturation with C-PAP control was 97-98% on room air.   CARDIAC DATA:  Normal sinus rhythm.   MOVEMENT/PARASOMNIA:  The total of 60 limb jerks were reported of which  51  were associated with arousal or awakening for a periodic limb movement with  arousal index of 9.1 per hour which is mildly increased.   IMPRESSION/RECOMMENDATIONS:  1.  Severe obstructive sleep apnea/hypopnea syndrome, AHI 63.1 per hour with      nonpositional events, moderate snoring and oxygen desaturation to 91%.  2.  Successful C-PAP titration to 12 CWP, AHI 0.7 per hour.  A small      Respironics ComfortGel nasal mask was used with heated humidifier.  The      technician suggested that because of oral venting she may benefit from      either a chin strap or a full-face mask at home.  This can be evaluated      by the [facility].  3.  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  4.  Note use of sedating medications, Ambien and amitriptyline at bedtime.      Clinton D. Maple Hudson, M.D.  Diplomate, Biomedical engineer of Sleep Medicine  Electronically Signed     CDY/MEDQ  D:  05/31/2005 11:54:32  T:  06/02/2005 00:34:22  Job:  [id number] 

## 2010-07-25 NOTE — Procedures (Signed)
[place]. [facility]  Patient:    Charlotte Whitehead, Charlotte Whitehead                        MRN: [id number] Proc. Date: 08/06/00 Adm. Date:  [id number] Attending:  Charna Elizabeth CC:         Lind Guest. August Saucer, M.D.   Procedure Report  DATE OF BIRTH:  1940-12-22.  PROCEDURE:  Esophagogastroduodenoscopy.  ENDOSCOPIST:  Anselmo Rod, M.D.  INSTRUMENT USED:  Olympus video panendoscope.  INDICATION FOR PROCEDURE:  Severe epigastric pain.  Rule out ulcers in a 70 year old African-American female.  PREPROCEDURE PREPARATION:  Informed consent was procured from the patient. The patient was fasted for eight hours prior to the procedure.  PREPROCEDURE PHYSICAL:  VITAL SIGNS:  The patient had stable vital signs.  NECK:  Supple.  CHEST:  Clear to auscultation.  S1, S2 regular.  ABDOMEN:  Soft with normal bowel sounds.  DESCRIPTION OF PROCEDURE:  The patient was placed in the left lateral decubitus position and sedated with 50 mg of Demerol and 5 mg of Versed intravenously.  Once the patient was adequately sedate and maintained on low-flow oxygen and continuous cardiac monitoring, the Olympus video panendoscope was advanced under direct vision to the esophagus.  The entire esophagus appeared normal without evidence of ring, stricture, masses, lesions, esophagitis, or Barretts mucosa.  The scope was then advanced into the stomach.  Except for a small hiatal hernia appreciated on high retroflexion, the entire gastric mucosa and the proximal small bowel appeared normal.  IMPRESSION:  Normal EGD except for small hiatal hernia.  RECOMMENDATIONS: 1. Proceed with abdominal ultrasound and HIDA scan on August 09, 2000, as    previously planned. 2. Avoid all nonsteroidals for now. 3. Outpatient follow-up after abdominal ultrasound and HIDA scan have been    done. DD:  08/07/00 TD:  08/07/00 Job: [id number]

## 2010-07-25 NOTE — H&P (Signed)
[place]. [facility]  Patient:    Charlotte Whitehead, Charlotte Whitehead                        MRN: [id number] Adm. Date:  [id number] Attending:  Gwenyth Bender                         History and Physical  CHIEF COMPLAINT: Chest pain with shortness of breath.  HISTORY OF PRESENT ILLNESS: This is the first recent [facility] admission for this 70 year old black female, who presented to the office complaining of increasing shortness of breath with cough.  She had been feeling well up until four days ago and noted at that time onset of increasing sinus congestion with cough and nasal drainage.  She gradually developed a cough productive of yellowish to brownish phlegm.  There has been intermittent reddish sputum production as well.  The patient had an old Allegra at home, which she took.  She also had one amoxicillin antibiotic that she took as well.  Today she noted increasing substernal pressure/pain while at rest with mild shortness of breath, no diaphoresis or nausea or vomiting.  No arm symptoms.  Her symptoms persisted and she came to the office for further evaluation and is admitted at this time.  PAST MEDICAL HISTORY:  1. Hypertension.  2. Allergic rhinitis.  She notably had missed her last office visit and had not been seen in the office since February 2000.  CURRENT MEDICATIONS:  1. Cardizem 180 mg p.o. q.d.  2. Generic Maxzide 25 mg q.d.  REVIEW OF SYSTEMS: As noted above.  SOCIAL HISTORY: She does not smoke though has previous history of tobacco abuse, none within the past three years.  No alcohol intake.  She denies any significant stress factors at this point.  She is employed at [employer].  PHYSICAL EXAMINATION:  GENERAL: She is a well-developed, well-nourished black female in moderate distress.  VITAL SIGNS: Blood pressure 137/79, pulse 103, respiratory rate 22, temperature 98 degrees.  HEENT: Head normocephalic, atraumatic.  Left  maxillary sinus tenderness and left frontal sinus tenderness to palpation.  Mild right maxillary sinus tenderness.  Nose shows mild turbinate edema bilaterally.  No scleral icterus. TMs with decreased light reflex on the left, normal on the right.  NECK: Supple.  Thyroid not enlarged.  Minimal posterior cervical nodes on the right versus left.  CHEST: Lungs show diminished breath sounds.  E/A changes in left base.  No rub or rhonchi appreciated.  CARDIOVASCULAR: Normal S1 and S2.  No S3, no S4.  There was a 1/6 systolic ejection murmur at the left sternal border.  Mild tenderness noted to palpation along the anterior chest wall region and along the substernal region.  Pressure in this area does not reproduce her chest pain, however.  ABDOMEN: Bowel sounds present.  She has mild epigastric tenderness.  No masses appreciated.  No suprapubic tenderness.  MUSCULOSKELETAL: No joint tenderness.  Negative Homans.  No edema.  Pulses intact.  No bruits appreciated.  LABORATORY DATA: EKG in the office was notable for normal sinus rhythm, normal axis; T wave inversion in V1 through V3 (new from previous tracing two years ago).  EKG in the hospital shows normal sinus rhythm with normal axis; no acute changes noted.  CBC revealed a WBC of 7600, hemoglobin 13.4, hematocrit 36.1; platelet count 310,000; Differential normal.  Electrolytes remarkable for sodium 134, potassium 2.5, chloride  94, CO2 31, BUN 15, creatinine 0.7, glucose 108. Calcium 9.2, total protein 7.4, albumin 3.9.  SGOT 24, SGPT 12.  Alkaline phosphatase 96, bilirubin 1.3.  CPK 238, MB negative.  Troponin negative.  IMPRESSION:  1. Substernal chest pain, rule out angina.  She notably has abnormal     electrocardiogram which is changed from before.  Previous stress test     approximately six years ago negative at that time.  2. Hypertension history.  3. Hypokalemia, most likely secondary to diuretic effect.  She denies      significant symptoms of hypokalemia at this time.  4. Acute sinusitis.  5. Asthmatic bronchitis.  6. Past history of tobacco abuse.  PLAN: The patient will be admitted to9 telemetry for further evaluation.  She will be placed on IV nitroglycerin and IV heparin, and placed on Tequin at 400 mg IV today followed by p.o. q.d.  Chest x-ray is pending.  She will need Flovent inhaler 2 puffs p.o. b.i.d. with expectorants.  Cardiology evaluation has been obtained with Dr. Algie Coffer.  If enzymes are negative she will proceed with stress Cardiolite study.  Further therapy thereafter. DD:  12/02/99 TD:  12/03/99 Job: [id number]

## 2010-07-25 NOTE — H&P (Signed)
[place]. [facility]  Patient:    Charlotte Whitehead, SCADDEN                        MRN: [id number] Adm. Date:  [id number] Disc. Date: [id number] Attending:  Charna Elizabeth CC:         Anselmo Rod, M.D.   History and Physical  CHIEF COMPLAINT:  The patient is a 70 year old female with symptomatic cholelithiasis.  HISTORY OF PRESENT ILLNESS:  I first evaluated Ms. Celmer on August 17, 2000, at which time she described multiple severe recurrent episodes of abdominal pain, which were attributed to eating certain type of foods.  She had no fevers and chills and no jaundice associated with this and no diarrhea or constipation. She described the pain in the right upper quadrant and the epigastrium.  The pain was so severe that the patient would actually cry.  She had been seen approximately two months ago at [facility] and almost a year ago for chest pain, which was likely symptomatic cholelithiasis at that time; however, an ultrasound was not done then.  PAST MEDICAL HISTORY:  Significant for asthma, for which she takes multiple medications.  She also has hypertension, for which she takes several medications.  Her only previous hospitalization is for chest pain in 2001, possibly secondary to cardiac, but cardiac workup was negative.  ALLERGIES:  She has no known drug allergies.  MEDICATIONS:  Dilantin XR 180 mg once a day, triamterene 37.5 mg a day, hyoscyamine 0.035 mg a day, Singulair 10 mg q.d., Advair 25/50 preparation b.i.d., and Ventolin inhaler twice a day.  She was also taking ciprofloxacin at the time she came in to see me.  PAST SURGICAL HISTORY:  She has had back surgery in the past.  REVIEW OF SYSTEMS:  She has had no jaundice, no severe fevers or chills.  PHYSICAL EXAMINATION:  VITAL SIGNS:  Stable on admission.  ABDOMEN:  She has mild right upper quadrant tenderness and epigastric pain, no rebound or guarding.  She has good bowel  sounds.  LABORATORY DATA:  Pending.  An ultrasound done prior to her visit to me, which was done on August 09, 2000, demonstrates cholelithiasis without evidence of cholecystitis and a normal-sized common bile duct.  IMPRESSION:  Symptomatic cholelithiasis.  PLAN:  Laparoscopic cholecystectomy, possible cholangiogram.  This will be done as a short-stay patient. DD:  09/30/00 TD:  09/30/00 Job: [id number]

## 2010-12-19 LAB — BASIC METABOLIC PANEL
BUN: 10
CO2: 31
Chloride: 99
Glucose, Bld: 139 — ABNORMAL HIGH
Potassium: 3.5
Sodium: 136

## 2010-12-19 LAB — POCT CARDIAC MARKERS
CKMB, poc: 1
CKMB, poc: <1 — ABNORMAL LOW
Operator id: 196461
Operator id: 196461
Troponin i, poc: <0.05
Troponin i, poc: <0.05

## 2010-12-19 LAB — URINALYSIS, ROUTINE W REFLEX MICROSCOPIC
Glucose, UA: NEGATIVE
Hgb urine dipstick: NEGATIVE
Ketones, ur: NEGATIVE
Protein, ur: NEGATIVE
pH: 7.5

## 2010-12-19 LAB — DIFFERENTIAL
Eosinophils Absolute: 0
Eosinophils Relative: 0
Lymphocytes Relative: 16
Lymphs Abs: 1.4
Monocytes Relative: 10

## 2010-12-19 LAB — CBC
HCT: 38.5
Hemoglobin: 13.4
MCHC: 34.7
MCV: 92.6
RDW: 13

## 2011-07-21 ENCOUNTER — Other Ambulatory Visit: Payer: Self-pay | Admitting: Internal Medicine

## 2011-07-21 ENCOUNTER — Ambulatory Visit
Admission: RE | Admit: 2011-07-21 | Discharge: 2011-07-21 | Disposition: A | Discharge status: Home / Self Care | Payer: Medicare Other | Source: Ambulatory Visit | Attending: Internal Medicine | Admitting: Internal Medicine

## 2011-07-23 ENCOUNTER — Other Ambulatory Visit: Payer: Self-pay | Admitting: Internal Medicine

## 2011-07-23 DIAGNOSIS — Z1231 Encounter for screening mammogram for malignant neoplasm of breast: Secondary | ICD-10-CM

## 2011-07-29 ENCOUNTER — Other Ambulatory Visit: Payer: Medicare Other

## 2011-07-31 ENCOUNTER — Ambulatory Visit
Admission: RE | Admit: 2011-07-31 | Discharge: 2011-07-31 | Disposition: A | Discharge status: Home / Self Care | Payer: Medicare Other | Source: Ambulatory Visit | Attending: Internal Medicine | Admitting: Internal Medicine

## 2011-07-31 MED ORDER — GADOBENATE DIMEGLUMINE 529 MG/ML IV SOLN
14.0000 mL | Freq: Once | INTRAVENOUS | Status: AC | PRN
  Administered 2011-07-31: 14 mL via INTRAVENOUS

## 2011-08-07 ENCOUNTER — Ambulatory Visit (INDEPENDENT_AMBULATORY_CARE_PROVIDER_SITE_OTHER): Payer: Medicare Other | Admitting: Internal Medicine

## 2011-08-07 ENCOUNTER — Ambulatory Visit (INDEPENDENT_AMBULATORY_CARE_PROVIDER_SITE_OTHER)
Admission: RE | Admit: 2011-08-07 | Discharge: 2011-08-07 | Disposition: A | Discharge status: Home / Self Care | Payer: Medicare Other | Source: Ambulatory Visit | Attending: Internal Medicine | Admitting: Internal Medicine

## 2011-08-07 ENCOUNTER — Other Ambulatory Visit: Payer: Medicare Other

## 2011-08-07 ENCOUNTER — Encounter: Payer: Self-pay | Admitting: Internal Medicine

## 2011-08-07 VITALS — BP 132/80 | HR 72 | Ht 63.0 in | Wt 158.2 lb

## 2011-08-07 DIAGNOSIS — J309 Allergic rhinitis, unspecified: Secondary | ICD-10-CM

## 2011-08-07 DIAGNOSIS — H1010 Acute atopic conjunctivitis, unspecified eye: Secondary | ICD-10-CM

## 2011-08-07 DIAGNOSIS — J449 Chronic obstructive pulmonary disease, unspecified: Secondary | ICD-10-CM

## 2011-08-07 DIAGNOSIS — G4733 Obstructive sleep apnea (adult) (pediatric): Secondary | ICD-10-CM

## 2011-08-07 NOTE — Patient Instructions (Signed)
Sample Dymista nasal spray     1-2 puffs in each nostril every night at bedtime      Try this instead of astelin and flonase to compare  Order- lab  Allergy Profile    Dx seasonal and perennial allergic rhinitis  Order- Schedule PFT   Dx COPD with asthma             CXR

## 2011-08-07 NOTE — Progress Notes (Signed)
08/07/11- 68 yoF former smoker Patient presents to re-establish for allergy. Patient c/o itchy eys, runny nose, and headaches.  LOV 08/13/05 for Allergic Rhinitis, OSA/ CPAP.  Previously followed by Dr.Eugene [place] on allergy vaccine. She now asks my help for complaints of frontal sinus pressure, mucus from eyes, eyes itching, nose itching, frequent nose blowing and sneezing. Diagnosed "asthma" in the past but she had been a smoker. She does worst in the spring but symptoms are perennial. Remote history of urticaria. No ENT surgery. She lives in a house with her daughter and denies any significant mildew or mold problems. Several close relatives with hayfever.  Prior to Admission medications   Medication Sig Start Date End Date Taking? Authorizing Provider  albuterol (PROAIR HFA) 108 (90 BASE) MCG/ACT inhaler Inhale 2 puffs into the lungs as needed.   Yes Historical Provider, MD  amitriptyline (ELAVIL) 10 MG tablet Take 10 mg by mouth at bedtime.     Yes Historical Provider, MD  aspirin 325 MG tablet Take 325 mg by mouth daily.     Yes Historical Provider, MD  azelastine (ASTELIN) 137 MCG/SPRAY nasal spray BID times 48H. 05/18/11  Yes Historical Provider, MD  carisoprodol (SOMA) 350 MG tablet Take 350 mg by mouth 3 (three) times daily as needed.     Yes Historical Provider, MD  celecoxib (CELEBREX) 200 MG capsule Take 200 mg by mouth daily.     Yes Historical Provider, MD  Cholecalciferol (VITAMIN D-3 PO) Take 1,000 Units by mouth.     Yes Historical Provider, MD  diltiazem (TIAZAC) 240 MG 24 hr capsule Take 240 mg by mouth daily.     Yes Historical Provider, MD  esomeprazole (NEXIUM) 40 MG capsule Take 40 mg by mouth daily.     Yes Historical Provider, MD  Fluticasone-Salmeterol (ADVAIR) 100-50 MCG/DOSE AEPB Inhale 1 puff into the lungs 2 (two) times daily.     Yes Historical Provider, MD  gabapentin (NEURONTIN) 300 MG capsule Take 300 mg by mouth at bedtime.     Yes Historical Provider, MD    HYDROcodone-acetaminophen (NORCO) 5-325 MG per tablet Take 1 tablet by mouth every 4 (four) hours as needed.     Yes Historical Provider, MD  hyoscyamine (LEVSIN, ANASPAZ) 0.125 MG tablet Take 0.125 mg by mouth 3 (three) times daily as needed.     Yes Historical Provider, MD  KLOR-CON M20 20 MEQ tablet Take by mouth BID times 48H. 08/05/11  Yes Historical Provider, MD  loratadine-pseudoephedrine (CLARITIN-D 12 HOUR) 5-120 MG per tablet Take 1 tablet by mouth daily.   Yes Historical Provider, MD  LORazepam (ATIVAN) 1 MG tablet Take 1 mg by mouth 3 (three) times daily as needed.     Yes Historical Provider, MD  magnesium oxide (MAG-OX) 400 MG tablet Take 400 mg by mouth daily.     Yes Historical Provider, MD  meclizine (ANTIVERT) 32 MG tablet Take 32 mg by mouth 3 (three) times daily as needed.     Yes Historical Provider, MD  montelukast (SINGULAIR) 10 MG tablet Take 10 mg by mouth at bedtime.   Yes Historical Provider, MD  promethazine (PHENERGAN) 25 MG tablet Take 25 mg by mouth every 4 (four) hours as needed.     Yes Historical Provider, MD  TRIAMTERENE-HCTZ PO Take 25 mg by mouth daily.     Yes Historical Provider, MD  zolpidem (AMBIEN) 5 MG tablet Take 5 mg by mouth at bedtime as needed.     Yes Historical Provider,  MD  fexofenadine (ALLEGRA) 180 MG tablet Take 180 mg by mouth daily.      Historical Provider, MD   Past Medical History  Diagnosis Date  . Essential hypertension, malignant   . Extrinsic asthma, unspecified   . Other bursitis disorders   . Enthesopathy of unspecified site   . Allergic rhinitis due to pollen   . Spinal stenosis, unspecified region other than cervical   . 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 Abdominal pain, unspecified site   . 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    Past Surgical History  Procedure Date  . Gallbladder surgery 1990's  . Back surgery 1980's   Family History  Problem Relation Age of Onset  . Allergies      brother, sister,  daughter  . Heart disease      maternal grandmother and maternal grandfather  . Prostate cancer Brother    History   Social History  . Marital Status: Divorced    Spouse Name: N/A    Number of Children: 1  . Years of Education: N/A   Occupational History  . Not on file.   Social History Main Topics  . Smoking status: Former Smoker -- 1.0 packs/day for 30 years    Types: Cigarettes    Quit date: 08/06/1997  . Smokeless tobacco: Never Used  . Alcohol Use: No  . Drug Use: No  . Sexually Active: Not on file   Other Topics Concern  . Not on file   Social History Narrative  . No narrative on file    ROS-see HPI Constitutional:   No-   weight loss, night sweats, fevers, chills, fatigue, lassitude. HEENT:   No-  headaches, difficulty swallowing, +tooth/dental problems, sore throat,       +sneezing, itching, ear ache, nasal congestion, post nasal drip,  CV:  No-   chest pain, orthopnea, PND, swelling in lower extremities, anasarca, dizziness, palpitations Resp: +   shortness of breath with exertion or at rest.                productive cough,  No non-productive cough,  No- coughing up of blood.              No-   change in color of mucus.  No- wheezing.   Skin: No-   rash or lesions. GI:    +heartburn, indigestion, abdominal pain, nausea, vomiting, diarrhea,                 change in bowel habits, loss of appetite GU: No-   dysuria, change in color of urine, no urgency or frequency.  No- flank pain. MS:  +joint pain or swelling.  No- decreased range of motion.  No- back pain. Neuro-     nothing unusual Psych:  No- change in mood or affect. No depression or anxiety.  No memory loss  OBJ- Physical Exam General- Alert, Oriented, Affect-appropriate, Distress- none acute, medium build Skin- rash-none, lesions- none, excoriation- none Lymphadenopathy- none Head- atraumatic            Eyes- Gross vision intact, PERRLA, conjunctivae and secretions clear            Ears- Hearing,  canals-normal            Nose- turbinate edema with clear mucus, no-Septal dev,  polyps, erosion, perforation             Throat- Mallampati III , mucosa clear , drainage- none, tonsils- atrophic, upper denture/missing teeth  Neck- flexible , trachea midline, no stridor , thyroid nl, carotid no bruit Chest - symmetrical excursion , unlabored           Heart/CV- RRR , no murmur , no gallop  , no rub, nl s1 s2                           - JVD- none , edema- none, stasis changes- none, varices- none           Lung- clear to P&A, wheeze- none, cough- none , dullness-none, rub- none           Chest wall-  Abd-  Br/ Gen/ Rectal- Not done, not indicated Extrem- cyanosis- none, clubbing, none, atrophy- none, strength- nl Neuro- grossly intact to observation

## 2011-08-11 LAB — ALLERGY FULL PROFILE
Alternaria Alternata: <0.1 kU/L (ref <0.35)
Bermuda Grass: <0.1 kU/L (ref <0.35)
Box Elder IgE: <0.1 kU/L (ref <0.35)
Candida Albicans: <0.1 kU/L (ref <0.35)
Cat Dander: <0.1 kU/L (ref <0.35)
Curvularia lunata: <0.1 kU/L (ref <0.35)
Dog Dander: <0.1 kU/L (ref <0.35)
Fescue: <0.1 kU/L (ref <0.35)
G005 Rye, Perennial: <0.1 kU/L (ref <0.35)
Goldenrod: <0.1 kU/L (ref <0.35)
Helminthosporium halodes: <0.1 kU/L (ref <0.35)
IgE (Immunoglobulin E), Serum: 141.1 IU/mL (ref 0.0–180.0)
Lamb's Quarters: <0.1 kU/L (ref <0.35)

## 2011-08-11 NOTE — Progress Notes (Signed)
Quick Note:  Pt aware of results. ______ 

## 2011-08-13 ENCOUNTER — Telehealth: Payer: Self-pay | Admitting: Pulmonary Disease

## 2011-08-13 DIAGNOSIS — J4520 Mild intermittent asthma, uncomplicated: Secondary | ICD-10-CM | POA: Insufficient documentation

## 2011-08-13 DIAGNOSIS — J310 Chronic rhinitis: Secondary | ICD-10-CM | POA: Insufficient documentation

## 2011-08-13 DIAGNOSIS — G4733 Obstructive sleep apnea (adult) (pediatric): Secondary | ICD-10-CM | POA: Insufficient documentation

## 2011-08-13 NOTE — Progress Notes (Signed)
Quick Note:  LMTCB ______ 

## 2011-08-13 NOTE — Assessment & Plan Note (Signed)
Plan-chest x-ray, PFT 

## 2011-08-13 NOTE — Telephone Encounter (Signed)
Notes Recorded by Waymon Budge, MD on 08/12/2011 at 1:08 PM Allergy profile- moderate allergy antibody levels, but not indicating allergy against any of the specific allergens on the profile list.  I spoke with patient about results and she verbalized understanding and had no questions

## 2011-08-13 NOTE — Assessment & Plan Note (Signed)
Seasonal and perennial allergic rhinitis. Plan-allergy profile. Sample Dymista

## 2011-08-13 NOTE — Assessment & Plan Note (Signed)
Good CPAP compliance and control. We will check with [facility] be sure we know her current CPAP pressure setting. No changes needed.

## 2011-08-19 ENCOUNTER — Ambulatory Visit (HOSPITAL_COMMUNITY): Payer: Medicare Other

## 2011-08-20 ENCOUNTER — Ambulatory Visit (HOSPITAL_COMMUNITY)
Admit: 2011-08-20 | Discharge: 2011-08-20 | Disposition: A | Discharge status: Home / Self Care | Payer: Medicare Other | Source: Ambulatory Visit | Attending: Internal Medicine | Admitting: Internal Medicine

## 2011-08-20 ENCOUNTER — Telehealth: Payer: Self-pay | Admitting: Internal Medicine

## 2011-08-20 DIAGNOSIS — Z1231 Encounter for screening mammogram for malignant neoplasm of breast: Secondary | ICD-10-CM | POA: Insufficient documentation

## 2011-08-20 MED ORDER — AZELASTINE-FLUTICASONE 137-50 MCG/ACT NA SUSP: 1.0000 | Freq: Every day | NASAL | Status: DC

## 2011-08-20 NOTE — Telephone Encounter (Signed)
Sample at front for patient and Rx sent to [facility] as well. Pt aware as she called to check status of phone note-Holly informed patient for me.

## 2011-08-31 ENCOUNTER — Telehealth: Payer: Self-pay | Admitting: Internal Medicine

## 2011-08-31 MED ORDER — AZELASTINE HCL 0.1 % NA SOLN: NASAL | Status: AC

## 2011-08-31 MED ORDER — FLUTICASONE PROPIONATE 50 MCG/ACT NA SUSP: NASAL | Status: AC

## 2011-08-31 NOTE — Telephone Encounter (Signed)
I spoke with pt and she states her insurance is not going to cover the dymista. She states this really helped her and is wanting to know if CDY had another nose spray that does the same as this does and works the same. Please advise CDY thanks  No Known Allergies

## 2011-08-31 NOTE — Telephone Encounter (Signed)
She has script for Astelin, which we can refill. We can add script for flonase/ fluticasone  #1, refill prn,   2 puffs each nostril once daily  Flonase plus Astelin matches the chemistry in Dymista.

## 2011-08-31 NOTE — Telephone Encounter (Signed)
I spoke with pt and is aware of CDY recs. Rx has been sent to [facility] per pt request. Nothing further was needed

## 2011-09-07 ENCOUNTER — Telehealth: Payer: Self-pay | Admitting: Internal Medicine

## 2011-09-07 NOTE — Telephone Encounter (Signed)
Pt returned triage's call.  Holly D Pryor ° °

## 2011-09-07 NOTE — Telephone Encounter (Signed)
Waymon Budge, MD 08/31/2011 9:16 AM Signed  She has script for Astelin, which we can refill.  We can add script for flonase/ fluticasone #1, refill prn, 2 puffs each nostril once daily  Flonase plus Astelin matches the chemistry in Dymista.    lmomtcb x1 for pt

## 2011-09-07 NOTE — Telephone Encounter (Signed)
I spoke with Dr. Maple Hudson and he states to have the pt do one med in AM and the other in PM. Pt advised.Charlotte Whitehead, CMA

## 2011-09-14 ENCOUNTER — Telehealth: Payer: Self-pay | Admitting: Internal Medicine

## 2011-09-14 MED ORDER — AZITHROMYCIN 250 MG PO TABS: ORAL_TABLET | ORAL | Status: DC

## 2011-09-14 NOTE — Telephone Encounter (Signed)
I spoke with pt and is aware of CDY recs. Rx has been sent to the pharmacy 

## 2011-09-14 NOTE — Telephone Encounter (Signed)
I spoke with pt and she c/o cough w/ light yellow phlem, runny nose, laryngitis, watery/itchy eyes, PND, HA, and slight sinus pressure across her eyes x Friday night. She has tried Singulair and Claritin. Pt is requesting to have something called in for her. Please advise Dr. Maple Hudson thanks  No Known Allergies    [facility]

## 2011-09-14 NOTE — Telephone Encounter (Signed)
Per CY-okay to give Zpak # 1 take as directed no refills and try OTC allegra.

## 2011-09-18 ENCOUNTER — Ambulatory Visit: Payer: Medicare Other | Admitting: Internal Medicine

## 2011-11-12 ENCOUNTER — Ambulatory Visit: Payer: Medicare Other | Admitting: Internal Medicine

## 2011-11-13 ENCOUNTER — Other Ambulatory Visit: Payer: Self-pay | Admitting: Gastroenterology

## 2011-12-04 LAB — LIPID PANEL
Cholesterol: 144 mg/dL (ref 0–200)
LDL Cholesterol: 88 mg/dL
LDl/HDL Ratio: 4.4
Triglycerides: 114 mg/dL (ref 40–160)

## 2011-12-04 LAB — CBC AND DIFFERENTIAL: HCT: 41 % (ref 36–46)

## 2011-12-11 ENCOUNTER — Ambulatory Visit (INDEPENDENT_AMBULATORY_CARE_PROVIDER_SITE_OTHER): Payer: Medicare Other | Admitting: Internal Medicine

## 2011-12-11 ENCOUNTER — Encounter: Payer: Self-pay | Admitting: Internal Medicine

## 2011-12-11 VITALS — BP 132/70 | HR 68 | Ht 68.0 in | Wt 151.0 lb

## 2011-12-11 DIAGNOSIS — G4733 Obstructive sleep apnea (adult) (pediatric): Secondary | ICD-10-CM

## 2011-12-11 DIAGNOSIS — J309 Allergic rhinitis, unspecified: Secondary | ICD-10-CM

## 2011-12-11 DIAGNOSIS — H1010 Acute atopic conjunctivitis, unspecified eye: Secondary | ICD-10-CM

## 2011-12-11 DIAGNOSIS — J449 Chronic obstructive pulmonary disease, unspecified: Secondary | ICD-10-CM

## 2011-12-11 LAB — PULMONARY FUNCTION TEST

## 2011-12-11 MED ORDER — METHYLPREDNISOLONE ACETATE 80 MG/ML IJ SUSP
80.0000 mg | Freq: Once | INTRAMUSCULAR | Status: AC
  Administered 2011-12-11: 80 mg via INTRAMUSCULAR

## 2011-12-11 MED ORDER — ALBUTEROL SULFATE HFA 108 (90 BASE) MCG/ACT IN AERS: 2.0000 | INHALATION_SPRAY | RESPIRATORY_TRACT | Status: AC | PRN

## 2011-12-11 MED ORDER — ALBUTEROL SULFATE HFA 108 (90 BASE) MCG/ACT IN AERS: 2.0000 | INHALATION_SPRAY | RESPIRATORY_TRACT | Status: DC | PRN

## 2011-12-11 MED ORDER — FLUTICASONE-SALMETEROL 250-50 MCG/DOSE IN AEPB: INHALATION_SPRAY | RESPIRATORY_TRACT | Status: DC

## 2011-12-11 MED ORDER — PHENYLEPHRINE HCL 1 % NA SOLN
3.0000 [drp] | Freq: Once | NASAL | Status: AC
  Administered 2011-12-11: 3 [drp] via NASAL

## 2011-12-11 NOTE — Progress Notes (Signed)
08/07/11- 40 yoF former smoker Patient presents to re-establish for allergy. Patient c/o itchy eys, runny nose, and headaches.  LOV 08/13/05 for Allergic Rhinitis, OSA/ CPAP.  Previously followed by Dr.Eugene [place] on allergy vaccine. She now asks my help for complaints of frontal sinus pressure, mucus from eyes, eyes itching, nose itching, frequent nose blowing and sneezing. Diagnosed "asthma" in the past but she had been a smoker. She does worst in the spring but symptoms are perennial. Remote history of urticaria. No ENT surgery. She lives in a house with her daughter and denies any significant mildew or mold problems. Several close relatives with hayfever.   12/11/11-  70 yoF former smoker followed for allergic rhinitis/ conjunctivitis, COPD with asthma, OSA.  FOR: review of PFT. Pt c/o sinus pressure, ear pain and SOB since last OV---having to use HFA frequently. Has run out of Advair. Continues Nexium. Still aware of occasional heartburn. Allergy Profile 08/07/11- Total IgE 114, no specific elevations. PFT-12/11/2011-completely normal. Small airway flows did improve 23% above normal baseline. CXR 08/11/11 reviewed with her IMPRESSION:  Slight hyperinflation configuration. No pulmonary edema,  pneumonia, or pleural effusion.  Original Report Authenticated By: Crawford Givens, M.D.   ROS-see HPI Constitutional:   No-   weight loss, night sweats, fevers, chills, fatigue, lassitude. HEENT:   No-  headaches, difficulty swallowing, +tooth/dental problems, sore throat,       +sneezing, itching, ear ache, nasal congestion, post nasal drip,  CV:  No-   chest pain, orthopnea, PND, swelling in lower extremities, anasarca, dizziness, palpitations Resp: +   shortness of breath with exertion or at rest.                productive cough,  No non-productive cough,  No- coughing up of blood.              No-   change in color of mucus.  No- wheezing.   Skin: No-   rash or lesions. GI:    +heartburn, indigestion,  abdominal pain, nausea, vomiting,  GU: MS:  +joint pain or swelling.   Neuro-     nothing unusual Psych:  No- change in mood or affect. No depression or anxiety.  No memory loss  OBJ- Physical Exam General- Alert, Oriented, Affect-appropriate, Distress- none acute, medium build Skin- rash-none, lesions- none, excoriation- none Lymphadenopathy- none Head- atraumatic            Eyes- Gross vision intact, PERRLA, conjunctivae and secretions clear            Ears- Hearing, canals-normal            Nose- +turbinate edema with clear mucus, no-Septal dev,  polyps, erosion, perforation             Throat- Mallampati III , mucosa clear , drainage- none, tonsils- atrophic, upper denture/missing teeth                             +Frequent throat clearing Neck- flexible , trachea midline, no stridor , thyroid nl, carotid no bruit Chest - symmetrical excursion , unlabored           Heart/CV- RRR , no murmur , no gallop  , no rub, nl s1 s2                           - JVD- none , edema- none, stasis changes- none, varices- none  Lung- clear to P&A, wheeze- none, cough- none , dullness-none, rub- none           Chest wall-  Abd-  Br/ Gen/ Rectal- Not done, not indicated Extrem- cyanosis- none, clubbing, none, atrophy- none, strength- nl Neuro- grossly intact to observation

## 2011-12-11 NOTE — Patient Instructions (Addendum)
Script for your albuterol HFA rescue inhaler to fill locally until your mail-order comes in  Sample and Script Advair 250 maintenance inhaler     1 puff, then rinse mouth well, twice every day. Script is for [employer] neo nasal  Depo 80

## 2011-12-11 NOTE — Progress Notes (Signed)
PFT done today. 

## 2011-12-16 ENCOUNTER — Telehealth: Payer: Self-pay | Admitting: Internal Medicine

## 2011-12-16 NOTE — Telephone Encounter (Signed)
Pt returned call.  Advised of CY's recs as stated below.  Pt okay with these recommendations and verbalized her understanding.  Pt to call back if her symptoms do not improve worsen with discolored mucus.  Nothing further needed at this time; will sign off.

## 2011-12-16 NOTE — Telephone Encounter (Signed)
LMTCB

## 2011-12-16 NOTE — Telephone Encounter (Signed)
Spoke with pt. She is c/o facial pressure/HA, ear pain, dizziness and prod cough with clear sputum x 2 days. Taking allegra with no relief. Would like something called in. CDY pt. Will forward to doc of the day per CDY request since he will be out of the office today. KC, please advise, thanks! No Known Allergies

## 2011-12-16 NOTE — Telephone Encounter (Signed)
Will forward to CDY now, ok per Katie 

## 2011-12-16 NOTE — Telephone Encounter (Signed)
Per CY-have patient get Sudafed at pharmacy and plain Mucinex OTC.

## 2011-12-20 NOTE — Assessment & Plan Note (Signed)
Allergy profile negative, suggesting this is not an IgE mediated complaint. We can recheck with skin tests in the future if necessary. Plan-nebulizer nasal decongestant, Depo-Medrol

## 2011-12-20 NOTE — Assessment & Plan Note (Addendum)
Normal pulmonary function tests indicate COPD is not the right diagnosis. We discussed reactive airways disease from irritant and nonspecific triggers, recognizing her allergy profile was negative. Discussed bronchodilator medications with concern that she may be overusing, and perhaps treating anxiety, mild esophageal irritation from GERD, or some other discomfort.  Plan-try increasing Advair to 250 and reducing use of rescue inhaler

## 2011-12-20 NOTE — Assessment & Plan Note (Deleted)
Is

## 2012-02-25 LAB — BASIC METABOLIC PANEL
Potassium: 3.8 mmol/L (ref 3.4–5.3)
Sodium: 139 mmol/L (ref 137–147)

## 2012-02-25 LAB — HEPATIC FUNCTION PANEL
ALT: 8 U/L (ref 7–35)
AST: 10 U/L — AB (ref 13–35)
Alkaline Phosphatase: 107 U/L (ref 25–125)
Bilirubin, Total: 0.6 mg/dL

## 2012-04-12 ENCOUNTER — Ambulatory Visit (INDEPENDENT_AMBULATORY_CARE_PROVIDER_SITE_OTHER): Payer: Medicare Other | Admitting: Internal Medicine

## 2012-04-12 ENCOUNTER — Encounter: Payer: Self-pay | Admitting: Internal Medicine

## 2012-04-12 VITALS — BP 120/72 | HR 71 | Ht 68.0 in | Wt 144.4 lb

## 2012-04-12 DIAGNOSIS — J4520 Mild intermittent asthma, uncomplicated: Secondary | ICD-10-CM

## 2012-04-12 DIAGNOSIS — J45909 Unspecified asthma, uncomplicated: Secondary | ICD-10-CM

## 2012-04-12 DIAGNOSIS — J310 Chronic rhinitis: Secondary | ICD-10-CM

## 2012-04-12 MED ORDER — ALBUTEROL SULFATE (2.5 MG/3ML) 0.083% IN NEBU: 2.5000 mg | INHALATION_SOLUTION | Freq: Four times a day (QID) | RESPIRATORY_TRACT | Status: AC | PRN

## 2012-04-12 NOTE — Patient Instructions (Addendum)
Sample Dymista nasal spray    1-2 puffs each nostril every night at bedtime.        Try this instead of Astelin for now  [facility] to use the rescue albuterol HFA inhaler occasionally if needed for shortness of breath  Script for albuterol nebulizer solution for use in nebulizer machine up to 4 times daily if needed for asthma/ shortness of breath

## 2012-04-12 NOTE — Progress Notes (Signed)
08/07/11- 62 yoF former smoker Patient presents to re-establish for allergy. Patient c/o itchy eys, runny nose, and headaches.  LOV 08/13/05 for Allergic Rhinitis, OSA/ CPAP.  Previously followed by Dr.Eugene Whiterocks on allergy vaccine. She now asks my help for complaints of frontal sinus pressure, mucus from eyes, eyes itching, nose itching, frequent nose blowing and sneezing. Diagnosed "asthma" in the past but she had been a smoker. She does worst in the spring but symptoms are perennial. Remote history of urticaria. No ENT surgery. She lives in a house with her daughter and denies any significant mildew or mold problems. Several close relatives with hayfever.   12/11/11-  27 yoF former smoker followed for allergic rhinitis/ conjunctivitis, COPD with asthma, OSA.  FOR: review of PFT. Pt c/o sinus pressure, ear pain and SOB since last OV---having to use HFA frequently. Has run out of Advair. Continues Nexium. Still aware of occasional heartburn. Allergy Profile 08/07/11- Total IgE 114, no specific elevations. PFT-12/11/2011-completely normal. Small airway flows did improve 23% above normal baseline. CXR 08/11/11 reviewed with her IMPRESSION:  Slight hyperinflation configuration. No pulmonary edema,  pneumonia, or pleural effusion.  Original Report Authenticated By: Crawford Givens, M.D.   04/12/12- 66 yoF former smoker followed for allergic rhinitis/ conjunctivitis, COPD with asthma, OSA. FOLLOWS FOR: Pt states no increase in SOB or wheezing since last visit; has noticed that if talking on the phone has had to hang up and use rescue inhaler. Sensitive to weather. Complains of nasal congestion and does not remember having Dymista nasal spray. Sudafed and Mucinex for sinus headaches are not enough.  ROS-see HPI Constitutional:   No-   weight loss, night sweats, fevers, chills, fatigue, lassitude. HEENT:   + headaches, difficulty swallowing, +tooth/dental problems, sore throat,       +sneezing, itching, ear  ache, +nasal congestion, post nasal drip,  CV:  No-   chest pain, orthopnea, PND, swelling in lower extremities, anasarca, dizziness, palpitations Resp: +   shortness of breath with exertion or at rest.                productive cough,  No non-productive cough,  No- coughing up of blood.              No-   change in color of mucus.  No- wheezing.   Skin: No-   rash or lesions. GI:    +heartburn, indigestion, abdominal pain, nausea, vomiting,  GU: MS:  +joint pain or swelling.   Neuro-     nothing unusual Psych:  No- change in mood or affect. No depression or anxiety.  No memory loss  OBJ- Physical Exam General- Alert, Oriented, Affect-appropriate, Distress- none acute, medium build Skin- rash-none, lesions- none, excoriation- none Lymphadenopathy- none Head- atraumatic            Eyes- Gross vision intact, PERRLA, conjunctivae and secretions clear            Ears- Hearing, canals-normal            Nose- +turbinate edema with clear mucus, no-Septal dev,  polyps, erosion, perforation             Throat- Mallampati III , mucosa clear , drainage- none, tonsils- atrophic, upper denture/missing teeth Neck- flexible , trachea midline, no stridor , thyroid nl, carotid no bruit Chest - symmetrical excursion , unlabored           Heart/CV- RRR , no murmur , no gallop  , no rub, nl s1 s2                           -  JVD- none , edema- none, stasis changes- none, varices- none           Lung- clear to P&A, wheeze- none, cough- none , dullness-none, rub- none           Chest wall-  Abd-  Br/ Gen/ Rectal- Not done, not indicated Extrem- cyanosis- none, clubbing, none, atrophy- none, strength- nl Neuro- grossly intact to observation

## 2012-04-14 ENCOUNTER — Encounter: Payer: Self-pay | Admitting: *Deleted

## 2012-04-21 ENCOUNTER — Encounter: Payer: Self-pay | Admitting: Internal Medicine

## 2012-04-21 NOTE — Assessment & Plan Note (Signed)
Plan-sample Dymista. If insurance will not cover we can give prescription for Flonase to add to her Astelin nasal spray

## 2012-04-21 NOTE — Assessment & Plan Note (Signed)
Good control with occasional use of rescue inhaler. We discussed triggers.

## 2012-05-02 ENCOUNTER — Encounter: Payer: Self-pay | Admitting: Hematology

## 2012-08-09 ENCOUNTER — Ambulatory Visit: Payer: Medicare Other | Admitting: Internal Medicine

## 2013-06-05 ENCOUNTER — Other Ambulatory Visit (HOSPITAL_COMMUNITY): Payer: Self-pay | Admitting: Internal Medicine

## 2013-06-05 DIAGNOSIS — Z1231 Encounter for screening mammogram for malignant neoplasm of breast: Secondary | ICD-10-CM

## 2013-06-08 ENCOUNTER — Ambulatory Visit (HOSPITAL_COMMUNITY)
Admission: RE | Admit: 2013-06-08 | Discharge: 2013-06-08 | Disposition: A | Discharge status: Home / Self Care | Payer: Medicare Other | Source: Ambulatory Visit | Attending: Internal Medicine | Admitting: Internal Medicine

## 2013-06-08 DIAGNOSIS — Z1231 Encounter for screening mammogram for malignant neoplasm of breast: Secondary | ICD-10-CM

## 2014-02-01 ENCOUNTER — Emergency Department (HOSPITAL_COMMUNITY)
Admission: EM | Admit: 2014-02-01 | Discharge: 2014-02-01 | Disposition: A | Discharge status: Home / Self Care | Payer: Medicare HMO | Attending: Emergency Medicine | Admitting: Emergency Medicine

## 2014-02-01 ENCOUNTER — Emergency Department (HOSPITAL_COMMUNITY): Payer: Medicare HMO

## 2014-02-01 ENCOUNTER — Encounter (HOSPITAL_COMMUNITY): Payer: Self-pay | Admitting: *Deleted

## 2014-02-01 DIAGNOSIS — Z87891 Personal history of nicotine dependence: Secondary | ICD-10-CM | POA: Insufficient documentation

## 2014-02-01 DIAGNOSIS — Z8739 Personal history of other diseases of the musculoskeletal system and connective tissue: Secondary | ICD-10-CM | POA: Insufficient documentation

## 2014-02-01 DIAGNOSIS — M549 Dorsalgia, unspecified: Secondary | ICD-10-CM

## 2014-02-01 DIAGNOSIS — G8929 Other chronic pain: Secondary | ICD-10-CM | POA: Diagnosis not present

## 2014-02-01 DIAGNOSIS — Z7951 Long term (current) use of inhaled steroids: Secondary | ICD-10-CM | POA: Diagnosis not present

## 2014-02-01 DIAGNOSIS — R51 Headache: Secondary | ICD-10-CM | POA: Insufficient documentation

## 2014-02-01 DIAGNOSIS — M79601 Pain in right arm: Secondary | ICD-10-CM | POA: Insufficient documentation

## 2014-02-01 DIAGNOSIS — J45909 Unspecified asthma, uncomplicated: Secondary | ICD-10-CM | POA: Insufficient documentation

## 2014-02-01 DIAGNOSIS — I119 Hypertensive heart disease without heart failure: Secondary | ICD-10-CM | POA: Diagnosis not present

## 2014-02-01 DIAGNOSIS — Z791 Long term (current) use of non-steroidal anti-inflammatories (NSAID): Secondary | ICD-10-CM | POA: Insufficient documentation

## 2014-02-01 DIAGNOSIS — F8081 Childhood onset fluency disorder: Secondary | ICD-10-CM | POA: Diagnosis not present

## 2014-02-01 DIAGNOSIS — Z79899 Other long term (current) drug therapy: Secondary | ICD-10-CM | POA: Insufficient documentation

## 2014-02-01 DIAGNOSIS — M79602 Pain in left arm: Secondary | ICD-10-CM | POA: Insufficient documentation

## 2014-02-01 DIAGNOSIS — Z201 Contact with and (suspected) exposure to tuberculosis: Secondary | ICD-10-CM | POA: Insufficient documentation

## 2014-02-01 LAB — COMPREHENSIVE METABOLIC PANEL
ALK PHOS: 119 U/L — AB (ref 39–117)
ALT: <5 U/L (ref 0–35)
ANION GAP: 11 (ref 5–15)
AST: 13 U/L (ref 0–37)
Albumin: 3.5 g/dL (ref 3.5–5.2)
BUN: 12 mg/dL (ref 6–23)
CHLORIDE: 99 meq/L (ref 96–112)
CO2: 27 meq/L (ref 19–32)
Calcium: 9.3 mg/dL (ref 8.4–10.5)
Creatinine, Ser: 1 mg/dL (ref 0.50–1.10)
GFR, EST AFRICAN AMERICAN: 64 mL/min — AB (ref >90)
GFR, EST NON AFRICAN AMERICAN: 55 mL/min — AB (ref >90)
GLUCOSE: 108 mg/dL — AB (ref 70–99)
Potassium: 4.3 mEq/L (ref 3.7–5.3)
Sodium: 137 mEq/L (ref 137–147)
Total Bilirubin: 0.3 mg/dL (ref 0.3–1.2)
Total Protein: 6.6 g/dL (ref 6.0–8.3)

## 2014-02-01 LAB — CBC WITH DIFFERENTIAL/PLATELET
Basophils Absolute: 0 10*3/uL (ref 0.0–0.1)
Basophils Relative: 1 % (ref 0–1)
Eosinophils Absolute: 0.2 10*3/uL (ref 0.0–0.7)
Eosinophils Relative: 3 % (ref 0–5)
HEMATOCRIT: 36.5 % (ref 36.0–46.0)
HEMOGLOBIN: 12.2 g/dL (ref 12.0–15.0)
LYMPHS ABS: 2.8 10*3/uL (ref 0.7–4.0)
Lymphocytes Relative: 42 % (ref 12–46)
MCH: 32.2 pg (ref 26.0–34.0)
MCHC: 33.4 g/dL (ref 30.0–36.0)
MCV: 96.3 fL (ref 78.0–100.0)
MONOS PCT: 9 % (ref 3–12)
Monocytes Absolute: 0.6 10*3/uL (ref 0.1–1.0)
NEUTROS ABS: 3 10*3/uL (ref 1.7–7.7)
Neutrophils Relative %: 45 % (ref 43–77)
Platelets: 245 10*3/uL (ref 150–400)
RBC: 3.79 MIL/uL — AB (ref 3.87–5.11)
RDW: 13.1 % (ref 11.5–15.5)
WBC: 6.5 10*3/uL (ref 4.0–10.5)

## 2014-02-01 LAB — URINE MICROSCOPIC-ADD ON

## 2014-02-01 LAB — URINALYSIS, ROUTINE W REFLEX MICROSCOPIC
BILIRUBIN URINE: NEGATIVE
Glucose, UA: NEGATIVE mg/dL
Hgb urine dipstick: NEGATIVE
KETONES UR: NEGATIVE mg/dL
Nitrite: NEGATIVE
PH: 5.5 (ref 5.0–8.0)
Protein, ur: NEGATIVE mg/dL
Specific Gravity, Urine: 1.007 (ref 1.005–1.030)
UROBILINOGEN UA: 0.2 mg/dL (ref 0.0–1.0)

## 2014-02-01 LAB — I-STAT TROPONIN, ED: Troponin i, poc: 0 ng/mL (ref 0.00–0.08)

## 2014-02-01 MED ORDER — HYDROCODONE-ACETAMINOPHEN 10-650 MG PO TABS: 1.0000 | ORAL_TABLET | Freq: Four times a day (QID) | ORAL | Status: AC | PRN

## 2014-02-01 MED ORDER — HYDROCODONE-ACETAMINOPHEN 5-325 MG PO TABS
2.0000 | ORAL_TABLET | Freq: Once | ORAL | Status: AC
  Administered 2014-02-01: 2 via ORAL
  Filled 2014-02-01: qty 2

## 2014-02-01 NOTE — Discharge Instructions (Signed)
In the ER stay tonight you had a workup for a TIA. Your symptoms do not appear consistent with this tonight, however the following instructions are educational materials to help watch out for warning signs. You need to follow-up with your primary care physician, continue to take daily 325 mg aspirin, and return to the ER if he develop any severe weakness, numbness, difficulty speaking, severe headache, dizziness, blurred vision, chest pain, shortness of breath.  A transient ischemic attack (TIA) is a "warning stroke" that causes stroke-like symptoms. Unlike a stroke, a TIA does not cause permanent damage to the brain. The symptoms of a TIA can happen very fast and do not last long. It is important to know the symptoms of a TIA and what to do. This can help prevent a major stroke or death. CAUSES   A TIA is caused by a temporary blockage in an artery in the brain or neck (carotid artery). The blockage does not allow the brain to get the blood supply it needs and can cause different symptoms. The blockage can be caused by either:  A blood clot.  Fatty buildup (plaque) in a neck or brain artery. RISK FACTORS  High blood pressure (hypertension).  High cholesterol.  Diabetes mellitus.  Heart disease.  The build up of plaque in the blood vessels (peripheral artery disease or atherosclerosis).  The build up of plaque in the blood vessels providing blood and oxygen to the brain (carotid artery stenosis).  An abnormal heart rhythm (atrial fibrillation).  Obesity.  Smoking.  Taking oral contraceptives (especially in combination with smoking).  Physical inactivity.  A diet high in fats, salt (sodium), and calories.  Alcohol use.  Use of illegal drugs (especially cocaine and methamphetamine).  Being female.  Being African American.  Being over the age of 39.  Family history of stroke.  Previous history of blood clots, stroke, TIA, or heart attack.  Sickle cell disease. SYMPTOMS    TIA symptoms are the same as a stroke but are temporary. These symptoms usually develop suddenly, or may be newly present upon awakening from sleep:  Sudden weakness or numbness of the face, arm, or leg, especially on one side of the body.  Sudden trouble walking or difficulty moving arms or legs.  Sudden confusion.  Sudden personality changes.  Trouble speaking (aphasia) or understanding.  Difficulty swallowing.  Sudden trouble seeing in one or both eyes.  Double vision.  Dizziness.  Loss of balance or coordination.  Sudden severe headache with no known cause.  Trouble reading or writing.  Loss of bowel or bladder control.  Loss of consciousness. DIAGNOSIS  Your caregiver may be able to determine the presence or absence of a TIA based on your symptoms, history, and physical exam. Computed tomography (CT scan) of the brain is usually performed to help identify a TIA. Other tests may be done to diagnose a TIA. These tests may include:  Electrocardiography.  Continuous heart monitoring.  Echocardiography.  Carotid ultrasonography.  Magnetic resonance imaging (MRI).  A scan of the brain circulation.  Blood tests. PREVENTION  The risk of a TIA can be decreased by appropriately treating high blood pressure, high cholesterol, diabetes, heart disease, and obesity and by quitting smoking, limiting alcohol, and staying physically active. TREATMENT  Time is of the essence. Since the symptoms of TIA are the same as a stroke, it is important to seek treatment as soon as possible because you may need a medicine to dissolve the clot (thrombolytic) that cannot be  given if too much time has passed. Treatment options vary. Treatment options may include rest, oxygen, intravenous (IV) fluids, and medicines to thin the blood (anticoagulants). Medicines and diet may be used to address diabetes, high blood pressure, and other risk factors. Measures will be taken to prevent short-term and  long-term complications, including infection from breathing foreign material into the lungs (aspiration pneumonia), blood clots in the legs, and falls. Treatment options include procedures to either remove plaque in the carotid arteries or dilate carotid arteries that have narrowed due to plaque. Those procedures are:  Carotid endarterectomy.  Carotid angioplasty and stenting. HOME CARE INSTRUCTIONS   Take all medicines prescribed by your caregiver. Follow the directions carefully. Medicines may be used to control risk factors for a stroke. Be sure you understand all your medicine instructions.  You may be told to take aspirin or the anticoagulant warfarin. Warfarin needs to be taken exactly as instructed.  Taking too much or too little warfarin is dangerous. Too much warfarin increases the risk of bleeding. Too little warfarin continues to allow the risk for blood clots. While taking warfarin, you will need to have regular blood tests to measure your blood clotting time. A PT blood test measures how long it takes for blood to clot. Your PT is used to calculate another value called an INR. Your PT and INR help your caregiver to adjust your dose of warfarin. The dose can change for many reasons. It is critically important that you take warfarin exactly as prescribed.  Many foods, especially foods high in vitamin K can interfere with warfarin and affect the PT and INR. Foods high in vitamin K include spinach, kale, broccoli, cabbage, collard and turnip greens, brussels sprouts, peas, cauliflower, seaweed, and parsley as well as beef and pork liver, green tea, and soybean oil. You should eat a consistent amount of foods high in vitamin K. Avoid major changes in your diet, or notify your caregiver before changing your diet. Arrange a visit with a dietitian to answer your questions.  Many medicines can interfere with warfarin and affect the PT and INR. You must tell your caregiver about any and all  medicines you take, this includes all vitamins and supplements. Be especially cautious with aspirin and anti-inflammatory medicines. Do not take or discontinue any prescribed or over-the-counter medicine except on the advice of your caregiver or pharmacist.  Warfarin can have side effects, such as excessive bruising or bleeding. You will need to hold pressure over cuts for longer than usual. Your caregiver or pharmacist will discuss other potential side effects.  Avoid sports or activities that may cause injury or bleeding.  Be mindful when shaving, flossing your teeth, or handling sharp objects.  Alcohol can change the body's ability to handle warfarin. It is best to avoid alcoholic drinks or consume only very small amounts while taking warfarin. Notify your caregiver if you change your alcohol intake.  Notify your dentist or other caregivers before procedures.  Eat a diet that includes 5 or more servings of fruits and vegetables each day. This may reduce the risk of stroke. Certain diets may be prescribed to address high blood pressure, high cholesterol, diabetes, or obesity.  A low-sodium, low-saturated fat, low-trans fat, low-cholesterol diet is recommended to manage high blood pressure.  A low-saturated fat, low-trans fat, low-cholesterol, and high-fiber diet may control cholesterol levels.  A controlled-carbohydrate, controlled-sugar diet is recommended to manage diabetes.  A reduced-calorie, low-sodium, low-saturated fat, low-trans fat, low-cholesterol diet is recommended to manage  obesity.  Maintain a healthy weight.  Stay physically active. It is recommended that you get at least 30 minutes of activity on most or all days.  Do not smoke.  Limit alcohol use even if you are not taking warfarin. Moderate alcohol use is considered to be:  No more than 2 drinks each day for men.  No more than 1 drink each day for nonpregnant women.  Stop drug abuse.  Home safety. A safe home  environment is important to reduce the risk of falls. Your caregiver may arrange for specialists to evaluate your home. Having grab bars in the bedroom and bathroom is often important. Your caregiver may arrange for equipment to be used at home, such as raised toilets and a seat for the shower.  Follow all instructions for follow-up with your caregiver. This is very important. This includes any referrals and lab tests. Proper follow up can prevent a stroke or another TIA from occurring. SEEK MEDICAL CARE IF:  You have personality changes.  You have difficulty swallowing.  You are seeing double.  You have dizziness.  You have a fever.  You have skin breakdown. SEEK IMMEDIATE MEDICAL CARE IF:  Any of these symptoms may represent a serious problem that is an emergency. Do not wait to see if the symptoms will go away. Get medical help right away. Call your local emergency services (911 in [place] Do not drive yourself to the hospital.  You have sudden weakness or numbness of the face, arm, or leg, especially on one side of the body.  You have sudden trouble walking or difficulty moving arms or legs.  You have sudden confusion.  You have trouble speaking (aphasia) or understanding.  You have sudden trouble seeing in one or both eyes.  You have a loss of balance or coordination.  You have a sudden, severe headache with no known cause.  You have new chest pain or an irregular heartbeat.  You have a partial or total loss of consciousness. MAKE SURE YOU:   Understand these instructions.  Will watch your condition.  Will get help right away if you are not doing well or get worse. Document Released: 12/03/2004 Document Revised: 02/28/2013 Document Reviewed: 05/31/2013 [facility] Patient Information 2015 [place], [place]. This information is not intended to replace advice given to you by your health care provider. Make sure you discuss any questions you have with your health care  provider.

## 2014-02-01 NOTE — ED Provider Notes (Signed)
CSN: [id number]     Arrival date & time 02/01/14  1819 History  This chart was scribed for non-physician practitioner working with No att. providers found by Elveria Risingimelie Horne, ED Scribe. This patient was seen in room A13C/A13C and the patient's care was started at 6:35 PM.   Chief Complaint  Patient presents with  . Back Pain  . Arm Pain    bil   The history is provided by the patient. No language interpreter was used.   HPI Comments: Caralyn GuileLinda L Schroth is a 73 y.o. female with history of chronic back pain who presents to the Emergency Department complaining of acute on chronic back pain and worsening bilateral arm pain for one week. Patient reports exacerbated pain with abduction, when lying on her arms, and when attempting to lift objects. Patient reports treatment with Hydrocodone, but denies relief. Patient's last dose was approximately four hours ago. Patient receives medication from Dr. August Saucerean. Patient reports that she has not spoken to him about her increased pain. Patient denies history of neck pain, nausea, vomiting, or  blurred vision. Patient reports recent headache. Patient shares episodes of bowel incontinence ongoing for one year; but states that she has maintained control of her bowels. Patient denies weakness/ numbness, history of cancer or IV drug use.     Past Medical History  Diagnosis Date  . Essential hypertension, malignant   . Extrinsic asthma, unspecified   . Other bursitis disorders   . Enthesopathy of unspecified site   . Allergic rhinitis due to pollen   . Spinal stenosis, unspecified region other than cervical   . 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 Abdominal pain, unspecified site   . 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 Headache(784.0)   . Fecal incontinence   . DJD (degenerative joint disease)   . Vertigo    Past Surgical History  Procedure Laterality Date  . Gallbladder surgery  1990's  . Back surgery  1980's   Family History  Problem  Relation Age of Onset  . Allergies      brother, sister, daughter  . Heart disease      maternal grandmother and maternal grandfather  . Prostate cancer Brother   . Liver disease Brother    History  Substance Use Topics  . Smoking status: Former Smoker -- 1.00 packs/day for 30 years    Types: Cigarettes    Quit date: 08/06/1997  . Smokeless tobacco: Never Used  . Alcohol Use: No   OB History    No data available     Review of Systems  Constitutional: Negative for fever and chills.  Eyes: Negative for visual disturbance.  Gastrointestinal: Negative for nausea and vomiting.  Musculoskeletal: Positive for myalgias, back pain and neck pain.  Neurological: Positive for headaches. Negative for weakness and numbness.   Allergies  Review of patient's allergies indicates no known allergies.  Home Medications   Prior to Admission medications   Medication Sig Start Date End Date Taking? Authorizing Provider  albuterol (PROVENTIL) (2.5 MG/3ML) 0.083% nebulizer solution Take 3 mLs (2.5 mg total) by nebulization every 6 (six) hours as needed for wheezing or shortness of breath. 04/12/12  Yes Waymon Budgelinton D Young, MD  amitriptyline (ELAVIL) 10 MG tablet Take 10 mg by mouth at bedtime.     Yes Historical Provider, MD  aspirin 325 MG tablet Take 325 mg by mouth daily.     Yes Historical Provider, MD  carisoprodol (SOMA) 350 MG tablet Take 350 mg by  mouth 3 (three) times daily as needed.     Yes Historical Provider, MD  celecoxib (CELEBREX) 200 MG capsule Take 200 mg by mouth daily.     Yes Historical Provider, MD  Cholecalciferol (VITAMIN D-3 PO) Take 1,000 Units by mouth.     Yes Historical Provider, MD  diltiazem (TIAZAC) 240 MG 24 hr capsule Take 240 mg by mouth daily.     Yes Historical Provider, MD  esomeprazole (NEXIUM) 40 MG capsule Take 40 mg by mouth daily.     Yes Historical Provider, MD  fexofenadine (ALLEGRA) 180 MG tablet Take 180 mg by mouth daily.     Yes Historical Provider, MD   fluticasone (FLONASE) 50 MCG/ACT nasal spray 2 puffs each nostril once a day 08/31/11  Yes Waymon Budge, MD  Fluticasone-Salmeterol (ADVAIR DISKUS) 250-50 MCG/DOSE AEPB 1 puff, then rinse mouth, twice daily 12/11/11  Yes Waymon Budge, MD  gabapentin (NEURONTIN) 300 MG capsule Take 300 mg by mouth 3 (three) times daily.    Yes Historical Provider, MD  hyoscyamine (LEVSIN, ANASPAZ) 0.125 MG tablet Take 0.125 mg by mouth 3 (three) times daily as needed.     Yes Historical Provider, MD  KLOR-CON M20 20 MEQ tablet Take 20 mEq by mouth 2 (two) times daily.  08/05/11  Yes Historical Provider, MD  LORazepam (ATIVAN) 1 MG tablet Take 1 mg by mouth 3 (three) times daily as needed.     Yes Historical Provider, MD  magnesium oxide (MAG-OX) 400 MG tablet Take 400 mg by mouth daily.     Yes Historical Provider, MD  meclizine (ANTIVERT) 32 MG tablet Take 32 mg by mouth 3 (three) times daily as needed.     Yes Historical Provider, MD  montelukast (SINGULAIR) 10 MG tablet Take 10 mg by mouth at bedtime.   Yes Historical Provider, MD  promethazine (PHENERGAN) 25 MG tablet Take 25 mg by mouth every 4 (four) hours as needed.     Yes Historical Provider, MD  TRIAMTERENE-HCTZ PO Take 25 mg by mouth daily.     Yes Historical Provider, MD  zolpidem (AMBIEN) 5 MG tablet Take 5 mg by mouth at bedtime as needed.     Yes Historical Provider, MD  albuterol (PROAIR HFA) 108 (90 BASE) MCG/ACT inhaler Inhale 2 puffs into the lungs every 4 (four) hours as needed for wheezing or shortness of breath. 12/11/11 12/10/12  Waymon Budge, MD  azelastine (ASTELIN) 137 MCG/SPRAY nasal spray 2 puffs each nostril once a day 08/31/11 08/30/12  Waymon Budge, MD  HYDROcodone-acetaminophen (LORCET) 10-650 MG per tablet Take 1 tablet by mouth every 6 (six) hours as needed for pain. 02/01/14   Monte Fantasia, PA-C   Triage Vitals:  BP 142/77 mmHg  Pulse 81  Temp(Src) 98.3 F (36.8 C)  Resp 18  Ht 5\' 3"  (1.6 m)  Wt 130 lb (58.968 kg)   BMI 23.03 kg/m2  SpO2 98% Physical Exam  Constitutional: She is oriented to person, place, and time. She appears well-developed and well-nourished. No distress.  HENT:  Head: Normocephalic and atraumatic.  Eyes: EOM are normal.  Neck: Neck supple. No tracheal deviation present.  Cardiovascular: Normal rate.   Pulmonary/Chest: Effort normal. No respiratory distress.  Musculoskeletal: Normal range of motion.  Left shoulder:180 degs of flexion and 170 degs of abduction with passive ROM.  Right shoulder: 180 degs of flexion and 170 degs of abduction with passive ROM Tender in L2., L3 region 5/5 motor strength in  all major muscles groups of upper and lower extremities. Tenderness to deltoid region with bilaterally with no point tenderness.  Neurological: She is alert and oriented to person, place, and time.  Skin: Skin is warm and dry.  Psychiatric: She has a normal mood and affect. Her behavior is normal.  Nursing note and vitals reviewed.   ED Course  Procedures (including critical care time)  COORDINATION OF CARE: 6:51 PM- Discussed treatment plan with patient at bedside and patient agreed to plan.   Labs Review Labs Reviewed  CBC WITH DIFFERENTIAL - Abnormal; Notable for the following:    RBC 3.79 (*)    All other components within normal limits  COMPREHENSIVE METABOLIC PANEL - Abnormal; Notable for the following:    Glucose, Bld 108 (*)    Alkaline Phosphatase 119 (*)    GFR calc non Af Amer 55 (*)    GFR calc Af Amer 64 (*)    All other components within normal limits  URINALYSIS, ROUTINE W REFLEX MICROSCOPIC - Abnormal; Notable for the following:    Leukocytes, UA TRACE (*)    All other components within normal limits  URINE MICROSCOPIC-ADD ON  I-STAT TROPOININ, ED    Imaging Review Ct Head Wo Contrast  02/01/2014   CLINICAL DATA:  Dysarthria  EXAM: CT HEAD WITHOUT CONTRAST  TECHNIQUE: Contiguous axial images were obtained from the base of the skull through the  vertex without intravenous contrast.  COMPARISON:  Head CT August 26, 2007; brain MRI Jul 31, 2011  FINDINGS: The ventricles are normal in size and configuration. There is mild frontal atrophy bilaterally, stable. There is no intracranial mass, hemorrhage, extra-axial fluid collection, or midline shift. Gray-white compartments are normal. No apparent acute infarct. The bony calvarium appears intact. The mastoid air cells are clear. There is rightward deviation of the nasal septum.  IMPRESSION: Mild frontal atrophy bilaterally. No intracranial mass, hemorrhage, or focal gray -white compartment lesions/acute appearing infarct.   Electronically Signed   By: Bretta BangWilliam  Woodruff M.D.   On: 02/01/2014 21:42     EKG Interpretation   Date/Time:  Thursday February 01 2014 20:38:19 EST Ventricular Rate:  67 PR Interval:  165 QRS Duration: 105 QT Interval:  412 QTC Calculation: 435 R Axis:   57 Text Interpretation:  Sinus rhythm Consider left atrial enlargement  Confirmed by WARD,  DO, KRISTEN [phone] on 02/01/2014 8:40:36 PM      MDM   Final diagnoses:  Stuttering  Bilateral arm pain  Chronic back pain    Patient here tonight complaining of exacerbation of chronic lower back pain for several days, bilateral shoulder pain 1 week which is reproducible with movement and tender to palpation in her deltoid region bilaterally. Patient reports her pain has been constant, denies any numbness, tingling. Neuro exam benign with no focal neurologic deficit. No bowel or bladder incontinence, saddle anesthesia, history of cancer, IV drug use, fever, nausea, concern for cauda equina. Patient's son present in the ER is concerned because patient has been having episodes of "stuttering" for the past 2 weeks. He states that these episodes are intermittent, and did not seem to have any aggravating or alleviating factors. Patient agrees with this statement, and they agree that her last episode was approximately one week  ago. Therefore these episodes last several minutes at a time and then resolve spontaneously. Patient denies having any weakness or focal neurologic deficit. Patient remaining asymptomatic other than her shoulder pain and back pain in the ER. Patient does take  aspirin 325 daily. With this reported stuttering, and patient's son's concerns about patient having a CVA, we will workup patient for possible TIA since her episodes have been intermittent.  Patient's lab work unremarkable at this point. No leukocytosis, UA unremarkable, no anemia, electrolyte abnormality. Head CT with impression: Mild frontal atrophy bilaterally. No intracranial mass, hemorrhage, or focal gray -white compartment lesions/acute appearing infarct.  Neurology consulted. I spoke with Dr. Amada Jupiter who agreed that with a benign workup tonight, patient should remain on 325 mg of aspirin daily, and that her signs and symptoms do not need any further workup in the ER or in the hospital tonight. He recommended patient follow-up with her primary care physician regarding these episodes.  Patient remains neurologically intact while in the ER. Her only complaint while here his of her chronic pain. Patient's pain in the ER tonight. With negative TIA workup, and recommendation from neurology to discharge home, we'll discharge patient and have her follow-up with her primary care physician. I discussed strict return precautions with patient, and strongly encouraged her to continue 325 mg of aspirin daily, and strongly encouraged her to follow-up with her primary care physician. Patient reporting she is out of her prescribed hydrocodone for her chronic pain. I will send patient home with a short course to help until she can see her PCP.  Patient was agreeable to this plan. I encouraged patient to call or return to ER should she have any questions or concerns.  I personally performed the services described in this documentation, which was scribed in  my presence. The recorded information has been reviewed and is accurate.  BP 101/54 mmHg  Pulse 65  Temp(Src) 98.3 F (36.8 C)  Resp 17  Ht 5\' 3"  (1.6 m)  Wt 130 lb (58.968 kg)  BMI 23.03 kg/m2  SpO2 98%  Signed,  Ladona Mow, PA-C 2:50 AM  Patient seen and discussed with Dr. Rochele Raring, M.D.  Monte Fantasia, PA-C 02/02/14 [phone]

## 2014-02-01 NOTE — ED Provider Notes (Signed)
Medical screening examination/treatment/procedure(s) were conducted as a shared visit with non-physician practitioner(s) and myself.  I personally evaluated the patient during the encounter.   EKG Interpretation   Date/Time:  Thursday February 01 2014 20:38:19 EST Ventricular Rate:  67 PR Interval:  165 QRS Duration: 105 QT Interval:  412 QTC Calculation: 435 R Axis:   57 Text Interpretation:  Sinus rhythm Consider left atrial enlargement  Confirmed by Bowen Goyal,  DO, Brallan Denio ([id number]) on 02/01/2014 8:40:36 PM       Pt is a 73 y.o. F with history of hypertension, chronic back pain, spinal stenosis who presents the emergency department with complaints of exacerbation of her chronic lower back pain for the past several days, bilateral shoulder pain is worse with movement and palpation for the past week that has been constant. No new numbness, tingling or focal weakness. No bowel or bladder incontinence. Patient's son is also concerned because she has had intermittent episodes of "stuttering" the past several weeks. Last episode was over one week ago. She describes these episodes is not being able to find the words she once to use. Episodes last several minutes and then resolved. No other associated neurologic deficit. She is currently neurologically intact. She is on aspirin 325 mg daily.  Workup in the ED is unremarkable. We'll consult neurology given symptoms happened over a week ago, I feel she can be discharged home for an outpatient twice a workup.  Layla MawKristen N Adryen Cookson, DO 02/01/14 [id number]

## 2014-02-01 NOTE — ED Notes (Signed)
Pt c/o chronic back pain.  States x 1 week she has been experiencing bil arm pain that increases with movement and when she lays on them.

## 2014-05-21 ENCOUNTER — Other Ambulatory Visit: Payer: Self-pay | Admitting: Orthopedic Surgery

## 2014-05-21 DIAGNOSIS — M542 Cervicalgia: Secondary | ICD-10-CM

## 2014-06-09 ENCOUNTER — Ambulatory Visit
Admission: RE | Admit: 2014-06-09 | Discharge: 2014-06-09 | Disposition: A | Discharge status: Home / Self Care | Payer: Medicare Other | Source: Ambulatory Visit | Attending: Orthopedic Surgery | Admitting: Orthopedic Surgery

## 2014-06-09 DIAGNOSIS — M542 Cervicalgia: Secondary | ICD-10-CM

## 2016-04-06 ENCOUNTER — Other Ambulatory Visit: Payer: Self-pay | Admitting: Internal Medicine

## 2016-04-06 DIAGNOSIS — Z1231 Encounter for screening mammogram for malignant neoplasm of breast: Secondary | ICD-10-CM

## 2016-04-30 ENCOUNTER — Ambulatory Visit: Payer: Medicare HMO

## 2016-05-13 ENCOUNTER — Ambulatory Visit: Payer: Medicare HMO

## 2016-06-15 ENCOUNTER — Ambulatory Visit
Admission: RE | Admit: 2016-06-15 | Discharge: 2016-06-15 | Disposition: A | Discharge status: Home / Self Care | Payer: Medicare HMO | Source: Ambulatory Visit | Attending: Internal Medicine | Admitting: Internal Medicine

## 2016-06-15 DIAGNOSIS — Z1231 Encounter for screening mammogram for malignant neoplasm of breast: Secondary | ICD-10-CM

## 2016-06-16 ENCOUNTER — Other Ambulatory Visit: Payer: Self-pay | Admitting: Internal Medicine

## 2016-06-16 DIAGNOSIS — R928 Other abnormal and inconclusive findings on diagnostic imaging of breast: Secondary | ICD-10-CM

## 2016-06-22 ENCOUNTER — Other Ambulatory Visit: Payer: Medicare HMO

## 2016-06-23 ENCOUNTER — Other Ambulatory Visit: Payer: Medicare HMO

## 2016-08-10 ENCOUNTER — Ambulatory Visit
Admission: RE | Admit: 2016-08-10 | Discharge: 2016-08-10 | Disposition: A | Discharge status: Home / Self Care | Payer: Medicare HMO | Source: Ambulatory Visit | Attending: Internal Medicine | Admitting: Internal Medicine

## 2016-08-10 DIAGNOSIS — R928 Other abnormal and inconclusive findings on diagnostic imaging of breast: Secondary | ICD-10-CM

## 2016-12-29 ENCOUNTER — Other Ambulatory Visit: Payer: Self-pay | Admitting: Internal Medicine

## 2016-12-29 ENCOUNTER — Ambulatory Visit
Admission: RE | Admit: 2016-12-29 | Discharge: 2016-12-29 | Disposition: A | Discharge status: Home / Self Care | Payer: Medicare HMO | Source: Ambulatory Visit | Attending: Internal Medicine | Admitting: Internal Medicine

## 2016-12-29 DIAGNOSIS — T1490XA Injury, unspecified, initial encounter: Secondary | ICD-10-CM

## 2017-10-21 ENCOUNTER — Other Ambulatory Visit: Payer: Self-pay | Admitting: Internal Medicine

## 2017-10-21 DIAGNOSIS — Z1231 Encounter for screening mammogram for malignant neoplasm of breast: Secondary | ICD-10-CM

## 2017-11-23 ENCOUNTER — Ambulatory Visit: Payer: Medicare HMO

## 2017-12-28 ENCOUNTER — Ambulatory Visit
Admission: RE | Admit: 2017-12-28 | Discharge: 2017-12-28 | Disposition: A | Discharge status: Home / Self Care | Payer: Medicare HMO | Source: Ambulatory Visit | Attending: Internal Medicine | Admitting: Internal Medicine

## 2017-12-28 DIAGNOSIS — Z1231 Encounter for screening mammogram for malignant neoplasm of breast: Secondary | ICD-10-CM

## 2018-04-26 ENCOUNTER — Encounter: Payer: Self-pay | Admitting: Sports Medicine

## 2018-04-26 ENCOUNTER — Ambulatory Visit: Payer: Medicare HMO | Admitting: Sports Medicine

## 2018-04-26 VITALS — BP 138/70 | HR 55 | Resp 16

## 2018-04-26 DIAGNOSIS — Q828 Other specified congenital malformations of skin: Secondary | ICD-10-CM | POA: Diagnosis not present

## 2018-04-26 DIAGNOSIS — M79671 Pain in right foot: Secondary | ICD-10-CM

## 2018-04-26 DIAGNOSIS — M79672 Pain in left foot: Secondary | ICD-10-CM

## 2018-04-26 NOTE — Progress Notes (Signed)
Subjective: Charlotte Whitehead is a 78 y.o. female patient who presents to office for evaluation of L>R  foot pain secondary to callus skin. Patient complains of pain at the lesion present at the balls for years. Patient has tried self trimming and vaseline with no relief in symptoms. Patient denies any other pedal complaints.   Review of Systems  All other systems reviewed and are negative.    Patient Active Problem List   Diagnosis Date Noted  . Obstructive sleep apnea 08/13/2011  . Nonallergic rhinitis 08/13/2011  . Mild intermittent asthma 08/13/2011    Current Outpatient Medications on File Prior to Visit  Medication Sig Dispense Refill  . albuterol (PROVENTIL) (2.5 MG/3ML) 0.083% nebulizer solution Take 3 mLs (2.5 mg total) by nebulization every 6 (six) hours as needed for wheezing or shortness of breath. 125 mL 3  . aspirin 81 MG chewable tablet Chew 81 mg by mouth daily.    . baclofen (LIORESAL) 10 MG tablet     . Cholecalciferol (VITAMIN D-3 PO) Take 1,000 Units by mouth.      . diltiazem (TIAZAC) 240 MG 24 hr capsule Take 240 mg by mouth daily.      Marland Kitchen esomeprazole (NEXIUM) 40 MG capsule Take 20 mg by mouth daily.     . fexofenadine (ALLEGRA) 180 MG tablet Take 180 mg by mouth daily.      . fluticasone (FLONASE) 50 MCG/ACT nasal spray 2 puffs each nostril once a day 48 g PRN  . Fluticasone-Salmeterol (ADVAIR DISKUS) 250-50 MCG/DOSE AEPB 1 puff, then rinse mouth, twice daily 180 each 3  . gabapentin (NEURONTIN) 300 MG capsule Take 300 mg by mouth 4 (four) times daily.     Marland Kitchen GLUCOSAMINE CHONDROITIN COMPLX PO Take 2 tablets by mouth daily.    Marland Kitchen HYDROcodone-acetaminophen (LORCET) 10-650 MG per tablet Take 1 tablet by mouth every 6 (six) hours as needed for pain. (Patient taking differently: Take 1 tablet by mouth every 6 (six) hours as needed for pain. Takes 5-325 mg  As needed) 15 tablet 0  . hydroxypropyl methylcellulose / hypromellose (ISOPTO TEARS / GONIOVISC) 2.5 % ophthalmic  solution 1 drop. Pt takes Crocodile Tears - as needed for dry eyes    . hyoscyamine (LEVSIN, ANASPAZ) 0.125 MG tablet Take 0.125 mg by mouth 3 (three) times daily as needed.      Marland Kitchen LORazepam (ATIVAN) 1 MG tablet Take 1 mg by mouth 2 (two) times daily.     . meclizine (ANTIVERT) 32 MG tablet Take 32 mg by mouth 3 (three) times daily as needed.      . montelukast (SINGULAIR) 10 MG tablet Take 10 mg by mouth at bedtime.    . promethazine (PHENERGAN) 25 MG tablet Take 25 mg by mouth every 4 (four) hours as needed.      Marland Kitchen QUEtiapine (SEROQUEL) 25 MG tablet Take 25 mg by mouth at bedtime.    . rosuvastatin (CRESTOR) 10 MG tablet     . TRIAMTERENE-HCTZ PO Take by mouth daily. Pt takes 37.5-25 mg every day    . zolpidem (AMBIEN) 5 MG tablet Take 5 mg by mouth at bedtime as needed.      Marland Kitchen albuterol (PROAIR HFA) 108 (90 BASE) MCG/ACT inhaler Inhale 2 puffs into the lungs every 4 (four) hours as needed for wheezing or shortness of breath. 3 Inhaler 3  . azelastine (ASTELIN) 137 MCG/SPRAY nasal spray 2 puffs each nostril once a day 90 mL prn   No current facility-administered  medications on file prior to visit.     No Known Allergies  Objective:  General: Alert and oriented x3 in no acute distress  Dermatology: Keratotic lesion present sub met 1 and 5 bilateral with skin lines transversing the lesion, pain is present with direct pressure to the lesion with a central nucleated core noted, no webspace macerations, no ecchymosis bilateral, all nails x 10 are well manicured.  Vascular: Dorsalis Pedis and Posterior Tibial pedal pulses 2/4, Capillary Fill Time 3 seconds, + pedal hair growth bilateral, no edema bilateral lower extremities, Temperature gradient within normal limits.  Neurology: Johney Maine sensation intact via light touch bilateral.  Musculoskeletal: Mild tenderness with palpation at the keratotic lesion sites L>R, Fat pad atrophy, Muscular strength 5/5 in all groups without pain or limitation on  range of motion. No lower extremity muscular or boney deformity noted.  Assessment and Plan: Problem List Items Addressed This Visit    None    Visit Diagnoses    Porokeratosis    -  Primary   Foot pain, bilateral          -Complete examination performed -Discussed treatment options -Parred keratoic lesions x 4 using a 15 blade; treated the area withSalinocaine covered with moleskin -Encouraged daily skin emollients -Encouraged use of pumice stone -Advised good supportive shoes and offloading padding as provided this visit  -Patient to return to office in 3 months or as needed or sooner if condition worsens.  Landis Martins, DPM

## 2018-07-26 ENCOUNTER — Ambulatory Visit: Payer: Medicare HMO | Admitting: Sports Medicine

## 2019-09-22 ENCOUNTER — Ambulatory Visit
Admission: RE | Admit: 2019-09-22 | Discharge: 2019-09-22 | Disposition: A | Discharge status: Home / Self Care | Payer: Medicare HMO | Source: Ambulatory Visit | Attending: Internal Medicine | Admitting: Internal Medicine

## 2019-09-22 ENCOUNTER — Other Ambulatory Visit: Payer: Self-pay | Admitting: Internal Medicine

## 2019-09-22 DIAGNOSIS — R918 Other nonspecific abnormal finding of lung field: Secondary | ICD-10-CM

## 2019-10-22 IMAGING — CR DG FACIAL BONES COMPLETE 3+V
4 series · 4 of 4 positions shown · non-contrast
Comparison: None.

CLINICAL DATA: Fall several days ago with facial pain, initial
encounter

EXAM:
FACIAL BONES COMPLETE 3+V

[w skull lat]
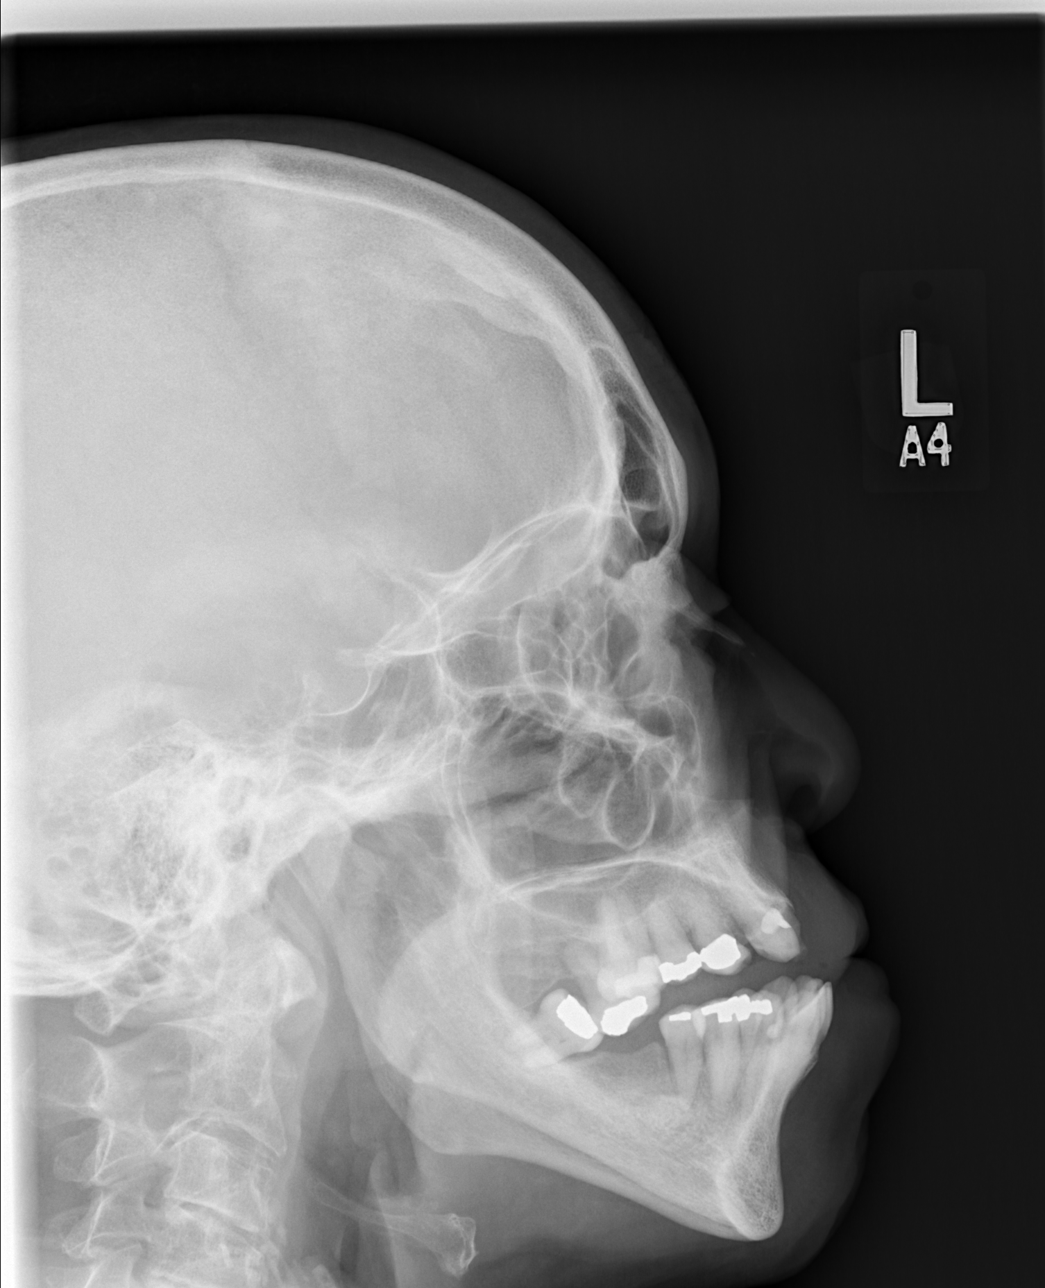

[[person_name] (1 of 2)]
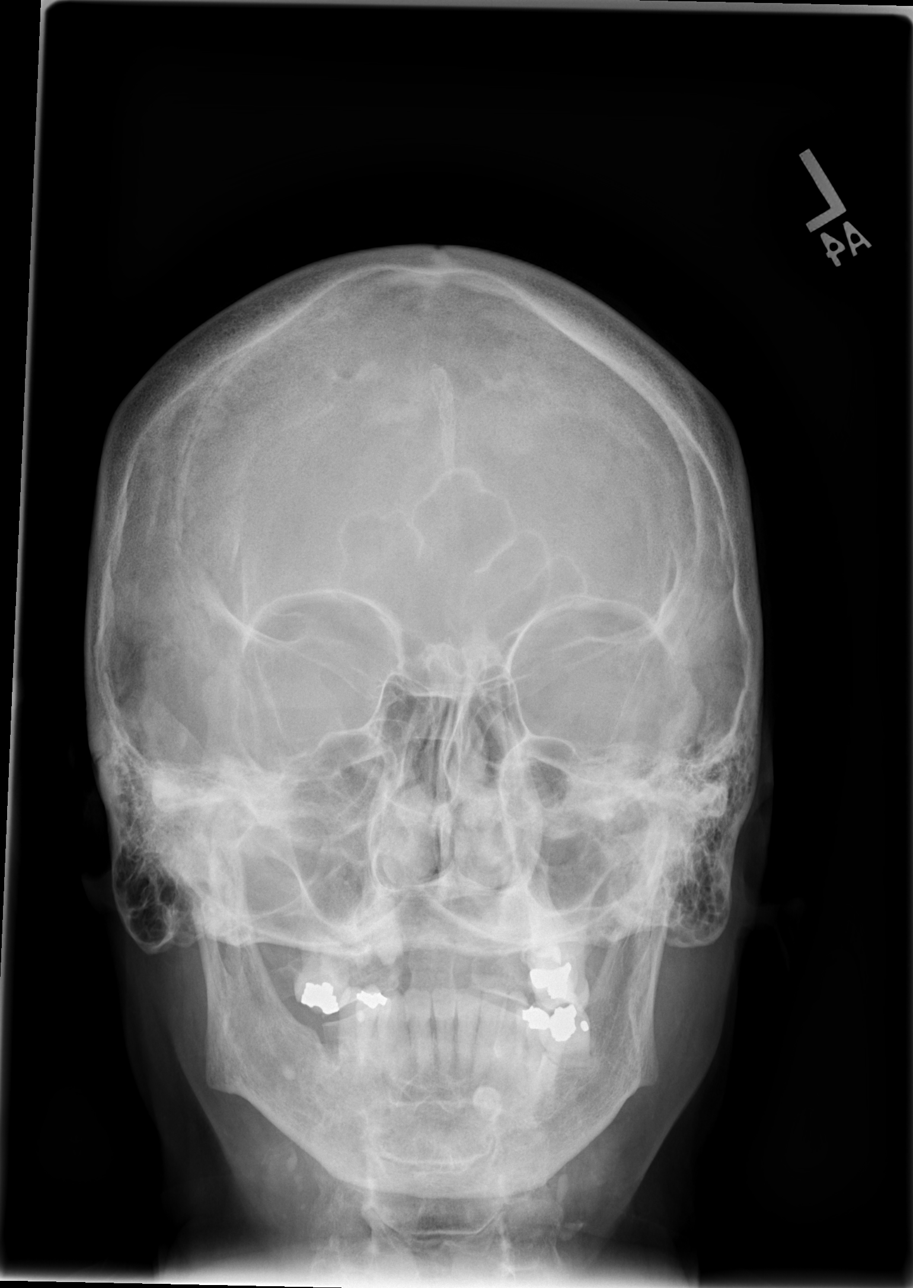

[[person_name] (2 of 2)]
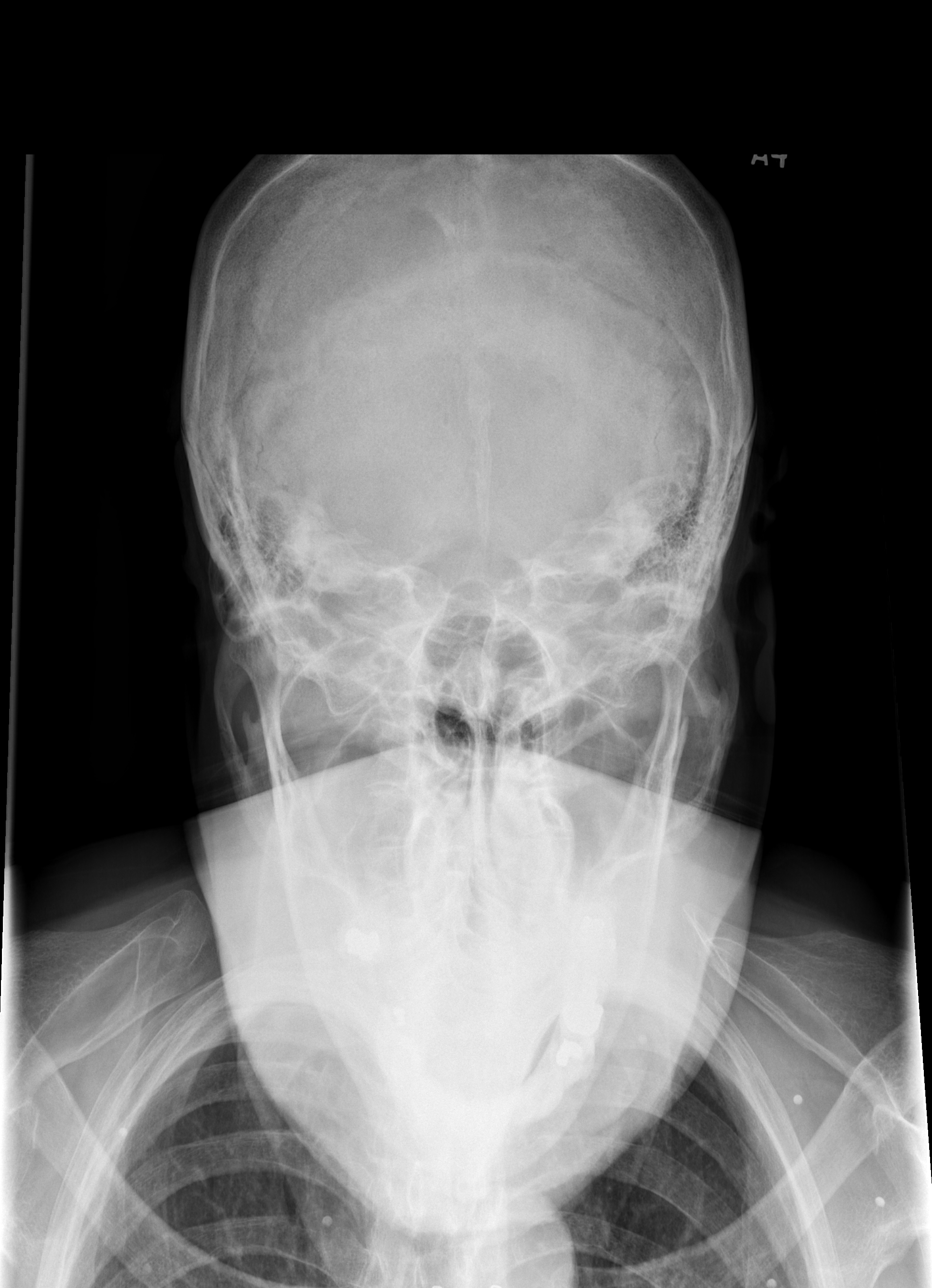

[w waters]
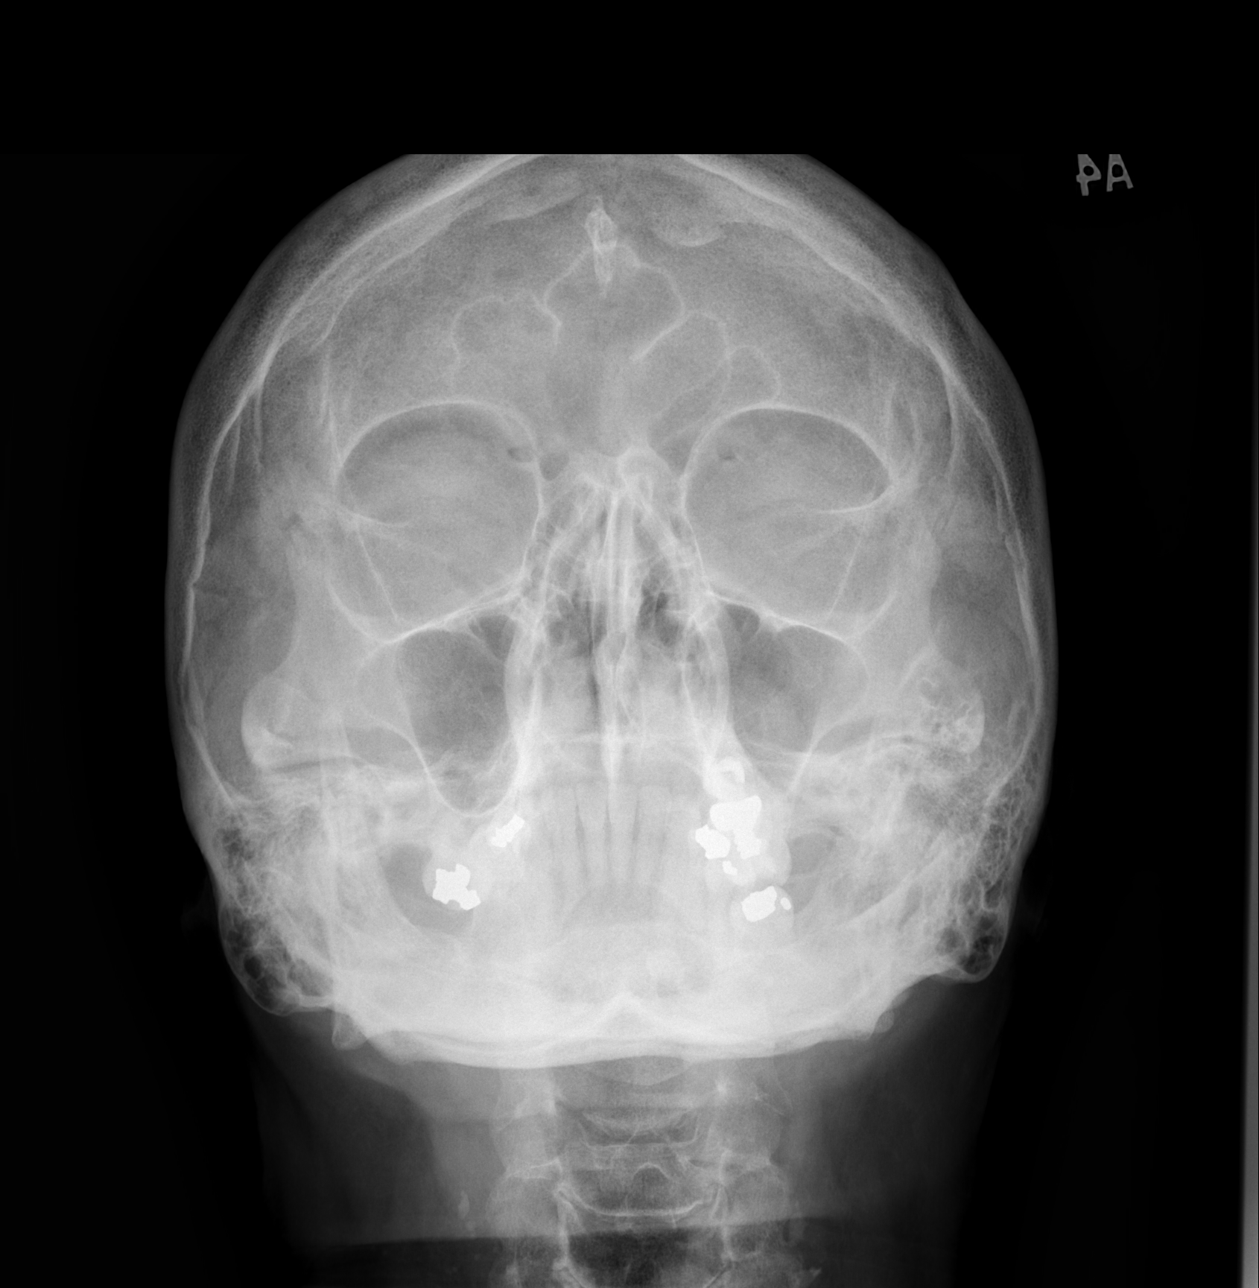

[4 of 4 positions shown; findings below may reference images not displayed]

FINDINGS: There is no evidence of fracture or other significant bone
abnormality. No orbital emphysema or sinus air-fluid levels are
seen.
IMPRESSION: No acute abnormality noted.

## 2020-01-16 ENCOUNTER — Other Ambulatory Visit: Payer: Self-pay | Admitting: Internal Medicine

## 2020-01-16 DIAGNOSIS — R109 Unspecified abdominal pain: Secondary | ICD-10-CM

## 2020-01-16 DIAGNOSIS — R319 Hematuria, unspecified: Secondary | ICD-10-CM

## 2020-05-30 ENCOUNTER — Other Ambulatory Visit: Payer: Self-pay | Admitting: Internal Medicine

## 2020-05-30 DIAGNOSIS — Z1231 Encounter for screening mammogram for malignant neoplasm of breast: Secondary | ICD-10-CM

## 2020-06-18 ENCOUNTER — Other Ambulatory Visit: Payer: Self-pay | Admitting: Orthopedic Surgery

## 2020-06-18 DIAGNOSIS — M542 Cervicalgia: Secondary | ICD-10-CM

## 2020-06-29 ENCOUNTER — Ambulatory Visit
Admission: RE | Admit: 2020-06-29 | Discharge: 2020-06-29 | Disposition: A | Discharge status: Home / Self Care | Payer: Medicare HMO | Source: Ambulatory Visit | Attending: Orthopedic Surgery | Admitting: Orthopedic Surgery

## 2020-06-29 ENCOUNTER — Other Ambulatory Visit: Payer: Self-pay

## 2020-06-29 DIAGNOSIS — M542 Cervicalgia: Secondary | ICD-10-CM

## 2020-07-02 ENCOUNTER — Other Ambulatory Visit: Payer: Self-pay | Admitting: Physical Medicine and Rehabilitation

## 2020-07-02 DIAGNOSIS — R202 Paresthesia of skin: Secondary | ICD-10-CM

## 2020-07-02 DIAGNOSIS — R200 Anesthesia of skin: Secondary | ICD-10-CM

## 2020-07-02 DIAGNOSIS — M79605 Pain in left leg: Secondary | ICD-10-CM

## 2020-07-20 ENCOUNTER — Ambulatory Visit
Admission: RE | Admit: 2020-07-20 | Discharge: 2020-07-20 | Disposition: A | Discharge status: Home / Self Care | Payer: Medicare HMO | Source: Ambulatory Visit | Attending: Physical Medicine and Rehabilitation | Admitting: Physical Medicine and Rehabilitation

## 2020-07-20 ENCOUNTER — Other Ambulatory Visit: Payer: Self-pay

## 2020-07-20 DIAGNOSIS — M79605 Pain in left leg: Secondary | ICD-10-CM

## 2020-07-20 DIAGNOSIS — R202 Paresthesia of skin: Secondary | ICD-10-CM

## 2020-07-20 DIAGNOSIS — R200 Anesthesia of skin: Secondary | ICD-10-CM

## 2020-07-23 ENCOUNTER — Ambulatory Visit
Admission: RE | Admit: 2020-07-23 | Discharge: 2020-07-23 | Disposition: A | Discharge status: Home / Self Care | Payer: Medicare HMO | Source: Ambulatory Visit | Attending: Internal Medicine | Admitting: Internal Medicine

## 2020-07-23 ENCOUNTER — Other Ambulatory Visit: Payer: Self-pay

## 2020-07-23 DIAGNOSIS — Z1231 Encounter for screening mammogram for malignant neoplasm of breast: Secondary | ICD-10-CM

## 2021-12-29 ENCOUNTER — Ambulatory Visit (INDEPENDENT_AMBULATORY_CARE_PROVIDER_SITE_OTHER): Payer: Medicare HMO | Admitting: Orthopedic Surgery

## 2021-12-29 ENCOUNTER — Encounter: Payer: Self-pay | Admitting: Orthopedic Surgery

## 2021-12-29 ENCOUNTER — Ambulatory Visit: Payer: Self-pay

## 2021-12-29 ENCOUNTER — Ambulatory Visit (INDEPENDENT_AMBULATORY_CARE_PROVIDER_SITE_OTHER): Payer: Medicare HMO

## 2021-12-29 DIAGNOSIS — M79671 Pain in right foot: Secondary | ICD-10-CM | POA: Diagnosis not present

## 2021-12-29 DIAGNOSIS — M79672 Pain in left foot: Secondary | ICD-10-CM

## 2021-12-29 NOTE — Progress Notes (Signed)
Office Visit Note   Patient: Charlotte Whitehead           Date of Birth: 03/25/1940           MRN: [id number] Visit Date: 12/29/2021              Requested by: Rogers Blocker, MD [address] [place],  [place] [id number] PCP: Rogers Blocker, MD  Chief Complaint  Patient presents with   Right Foot - Pain   Left Foot - Pain      HPI: Patient is a 81 year old woman who presents with fractures of both feet.  Patient states that she stood up quickly her feet were trapped in a blanket and she fell on the floor.  Patient is currently in a Darco on the right foot and a postoperative shoe on the left patient feels unstable with using the Darco.  Patient has now developed ulcers on the great toe and second toe right foot.  Assessment & Plan: Visit Diagnoses:  1. Bilateral foot pain     Plan: The abrasions to the right foot appear to be healing.  We will place her in a postoperative shoe on the right so she can ambulate better.  Plan to follow-up in 4 weeks with three-view radiographs of both feet.  Follow-Up Instructions: No follow-ups on file.   Ortho Exam  Patient is alert, oriented, no adenopathy, well-dressed, normal affect, normal respiratory effort. Examination patient has palpable anterior tibial and posterior tibial pulses.  She has abrasions to the medial lateral aspect of the second toe right foot as well as medial to the great toe.  There is no sausage digit swelling no cellulitis no exposed bone or tendon.  Patient has valgus alignment to the second toe right foot.  Imaging: XR Foot Complete Right  Result Date: 12/29/2021 2 view radiographs of the right foot shows a fracture of the proximal phalanx of the second metatarsal with valgus alignment.  XR Foot Complete Left  Result Date: 12/29/2021 2 view radiographs of the left foot shows a fracture through the distal fifth metatarsal with impaction.  No images are attached to the encounter.  Labs: Lab Results   Component Value Date   HGBA1C 6.0 02/25/2012   REPTSTATUS 01/01/2009 FINAL 12/30/2008   REPTSTATUS 12/30/2008 FINAL 12/30/2008   REPTSTATUS 01/02/2009 FINAL 12/30/2008   CULT  12/30/2008    NO SALMONELLA, SHIGELLA, CAMPYLOBACTER, OR YERSINIA ISOLATED     Lab Results  Component Value Date   ALBUMIN 3.5 02/01/2014   ALBUMIN 3.1 (L) 01/03/2009   ALBUMIN 2.8 (L) 01/02/2009    No results found for: "MG" No results found for: "VD25OH"  No results found for: "PREALBUMIN"    Latest Ref Rng & Units 02/01/2014    8:34 PM 12/04/2011   12:00 AM 01/03/2009    5:35 AM  CBC EXTENDED  WBC 4.0 - 10.5 K/uL 6.5  6.7  7.7   RBC 3.87 - 5.11 MIL/uL 3.79   3.68   Hemoglobin 12.0 - 15.0 g/dL 12.2  14.0  12.0   HCT 36.0 - 46.0 % 36.5  41  35.5   Platelets 150 - 400 K/uL 245  328  280   NEUT# 1.7 - 7.7 K/uL 3.0     Lymph# 0.7 - 4.0 K/uL 2.8        There is no height or weight on file to calculate BMI.  Orders:  Orders Placed This Encounter  Procedures   XR  Foot Complete Right   XR Foot Complete Left   No orders of the defined types were placed in this encounter.    Procedures: No procedures performed  Clinical Data: No additional findings.  ROS:  All other systems negative, except as noted in the HPI. Review of Systems  Objective: Vital Signs: There were no vitals taken for this visit.  Specialty Comments:  No specialty comments available.  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     Other bursitis disorders    Spinal stenosis, unspecified region other than cervical    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    Vertigo     Family History  Problem Relation Age of Onset   Allergies Other        brother, sister, daughter   Heart disease Other        maternal grandmother and maternal grandfather   Prostate cancer Brother    Liver disease Brother     Past Surgical History:  Procedure Laterality Date   BACK SURGERY  1980's   BREAST BIOPSY Right    40 years ago- thinks right - benign   GALLBLADDER SURGERY  1990's   Social History   Occupational History   Not on file  Tobacco Use   Smoking status: Former    Packs/day: 1.00    Years: 30.00    Total pack years: 30.00    Types: Cigarettes    Quit date: 08/06/1997    Years since quitting: 24.4   Smokeless tobacco: Never  Substance and Sexual Activity   Alcohol use: No   Drug use: No   Sexual activity: Not on file

## 2022-01-26 ENCOUNTER — Encounter: Payer: Self-pay | Admitting: Orthopedic Surgery

## 2022-01-26 ENCOUNTER — Ambulatory Visit: Payer: Medicare HMO | Admitting: Orthopedic Surgery

## 2022-01-26 ENCOUNTER — Ambulatory Visit (INDEPENDENT_AMBULATORY_CARE_PROVIDER_SITE_OTHER): Payer: Medicare HMO

## 2022-01-26 ENCOUNTER — Ambulatory Visit: Payer: Self-pay

## 2022-01-26 VITALS — Ht 63.0 in | Wt 146.0 lb

## 2022-01-26 DIAGNOSIS — M79671 Pain in right foot: Secondary | ICD-10-CM

## 2022-01-26 DIAGNOSIS — M79672 Pain in left foot: Secondary | ICD-10-CM | POA: Diagnosis not present

## 2022-02-15 ENCOUNTER — Encounter: Payer: Self-pay | Admitting: Orthopedic Surgery

## 2022-02-15 NOTE — Progress Notes (Signed)
Office Visit Note   Patient: Charlotte Whitehead           Date of Birth: February 14, 1941           MRN: [id number] Visit Date: 01/26/2022              Requested by: Gwenyth Bender, MD [address] [place],  [place] [address] PCP: Gwenyth Bender, MD  Chief Complaint  Patient presents with   Right Foot - Follow-up, Fracture   Left Foot - Fracture, Follow-up      HPI: Patient is a 81 year old woman who presents with bilateral foot pain with delayed union of 1/5 metatarsal shaft fracture on the left.  Assessment & Plan: Visit Diagnoses:  1. Bilateral foot pain     Plan: Recommend patient can discontinue her postoperative shoes and advance to a cushioned sneaker.  Follow-Up Instructions: Return in about 4 weeks (around 02/23/2022).   Ortho Exam  Patient is alert, oriented, no adenopathy, well-dressed, normal affect, normal respiratory effort. The fracture of the left fifth metatarsal neck appears healed no pain with direct palpation.  Patient has a pressure ulcer beneath the first metatarsal head right foot and the second toe and the callus was pared.  Patient also has an ulcer beneath the fifth metatarsal head and this ulcer was also pared there were no open wounds.  Imaging: No results found. No images are attached to the encounter.  Labs: Lab Results  Component Value Date   HGBA1C 6.0 02/25/2012   REPTSTATUS 01/01/2009 FINAL 12/30/2008   REPTSTATUS 12/30/2008 FINAL 12/30/2008   REPTSTATUS 01/02/2009 FINAL 12/30/2008   CULT  12/30/2008    NO SALMONELLA, SHIGELLA, CAMPYLOBACTER, OR YERSINIA ISOLATED     Lab Results  Component Value Date   ALBUMIN 3.5 02/01/2014   ALBUMIN 3.1 (L) 01/03/2009   ALBUMIN 2.8 (L) 01/02/2009    No results found for: "MG" No results found for: "VD25OH"  No results found for: "PREALBUMIN"    Latest Ref Rng & Units 02/01/2014    8:34 PM 12/04/2011   12:00 AM 01/03/2009    5:35 AM  CBC EXTENDED  WBC 4.0 - 10.5 K/uL 6.5  6.7   7.7   RBC 3.87 - 5.11 MIL/uL 3.79   3.68   Hemoglobin 12.0 - 15.0 g/dL 61.4  43.1  54.0   HCT 36.0 - 46.0 % 36.5  41  35.5   Platelets 150 - 400 K/uL 245  328  280   NEUT# 1.7 - 7.7 K/uL 3.0     Lymph# 0.7 - 4.0 K/uL 2.8        Body mass index is 25.86 kg/m.  Orders:  Orders Placed This Encounter  Procedures   XR Foot Complete Right   XR Foot Complete Left   No orders of the defined types were placed in this encounter.    Procedures: No procedures performed  Clinical Data: No additional findings.  ROS:  All other systems negative, except as noted in the HPI. Review of Systems  Objective: Vital Signs: Ht 5\' 3"  (1.6 m)   Wt 146 lb (66.2 kg)   BMI 25.86 kg/m   Specialty Comments:  No specialty comments available.  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    Other bursitis disorders    Spinal stenosis, unspecified region other than cervical    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    Vertigo     Family History  Problem Relation Age of Onset   Allergies Other        brother, sister, daughter   Heart disease Other        maternal grandmother and maternal grandfather   Prostate cancer Brother    Liver disease Brother     Past Surgical History:  Procedure Laterality Date   BACK SURGERY  1980's   BREAST BIOPSY Right    40 years ago- thinks right - benign   GALLBLADDER SURGERY  1990's   Social History   Occupational History   Not on file  Tobacco Use   Smoking status: Former     Packs/day: 1.00    Years: 30.00    Total pack years: 30.00    Types: Cigarettes    Quit date: 08/06/1997    Years since quitting: 24.5   Smokeless tobacco: Never  Substance and Sexual Activity   Alcohol use: No   Drug use: No   Sexual activity: Not on file

## 2022-02-23 ENCOUNTER — Ambulatory Visit: Payer: Medicare HMO | Admitting: Orthopedic Surgery

## 2022-02-23 ENCOUNTER — Encounter: Payer: Self-pay | Admitting: Orthopedic Surgery

## 2022-02-23 DIAGNOSIS — M79672 Pain in left foot: Secondary | ICD-10-CM | POA: Diagnosis not present

## 2022-02-23 DIAGNOSIS — B351 Tinea unguium: Secondary | ICD-10-CM

## 2022-02-23 DIAGNOSIS — L97529 Non-pressure chronic ulcer of other part of left foot with unspecified severity: Secondary | ICD-10-CM

## 2022-02-23 NOTE — Progress Notes (Signed)
Office Visit Note   Patient: Charlotte Whitehead           Date of Birth: 04-11-1940           MRN: [id number] Visit Date: 02/23/2022              Requested by: Gwenyth Bender, MD [address] [place],  [place] [address] PCP: Gwenyth Bender, MD  Chief Complaint  Patient presents with   Right Foot - Follow-up   Left Foot - Follow-up      HPI: Patient is a 81 year old woman who has had pain in the left foot status post delayed union of 1/5 metatarsal neck fracture.  Patient also presents at this time with a Wagner grade 1 ulcer beneath the left first metatarsal head as well as painful thickened discolored onychomycotic nails x 10.  Assessment & Plan: Visit Diagnoses:  1. Pain in left foot   2. Onychomycosis     Plan: Nails were trimmed x 10 and ulcer debrided x 1.  Patient may advance to a new balance walking sneaker.  Follow-Up Instructions: Return in about 3 months (around 05/25/2022).   Ortho Exam  Patient is alert, oriented, no adenopathy, well-dressed, normal affect, normal respiratory effort. Examination patient has no tenderness to palpation across the left foot.  The metatarsal neck fracture appears healed.  She has palpable pulses bilaterally.  She has an ulcer beneath the left first metatarsal head.  After informed consent a 10 blade knife was used debride the skin and soft tissue back to healthy viable granulation tissue this was touched with silver nitrate and a Band-Aid was applied.  Patient has thickened discolored onychomycotic nails x 10 there is no ingrown paronychial infection.  After informed consent the nails were trimmed x 10 without complications.  Imaging: No results found. No images are attached to the encounter.  Labs: Lab Results  Component Value Date   HGBA1C 6.0 02/25/2012   REPTSTATUS 01/01/2009 FINAL 12/30/2008   REPTSTATUS 12/30/2008 FINAL 12/30/2008   REPTSTATUS 01/02/2009 FINAL 12/30/2008   CULT  12/30/2008    NO SALMONELLA,  SHIGELLA, CAMPYLOBACTER, OR YERSINIA ISOLATED     Lab Results  Component Value Date   ALBUMIN 3.5 02/01/2014   ALBUMIN 3.1 (L) 01/03/2009   ALBUMIN 2.8 (L) 01/02/2009    No results found for: "MG" No results found for: "VD25OH"  No results found for: "PREALBUMIN"    Latest Ref Rng & Units 02/01/2014    8:34 PM 12/04/2011   12:00 AM 01/03/2009    5:35 AM  CBC EXTENDED  WBC 4.0 - 10.5 K/uL 6.5  6.7  7.7   RBC 3.87 - 5.11 MIL/uL 3.79   3.68   Hemoglobin 12.0 - 15.0 g/dL 38.2  50.5  39.7   HCT 36.0 - 46.0 % 36.5  41  35.5   Platelets 150 - 400 K/uL 245  328  280   NEUT# 1.7 - 7.7 K/uL 3.0     Lymph# 0.7 - 4.0 K/uL 2.8        There is no height or weight on file to calculate BMI.  Orders:  No orders of the defined types were placed in this encounter.  No orders of the defined types were placed in this encounter.    Procedures: No procedures performed  Clinical Data: No additional findings.  ROS:  All other systems negative, except as noted in the HPI. Review of Systems  Objective: Vital Signs: There were no  vitals taken for this visit.  Specialty Comments:  No specialty comments available.  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    Other bursitis disorders    Spinal stenosis, unspecified region other than cervical    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    Vertigo     Family History  Problem Relation Age of Onset   Allergies Other        brother, sister, daughter   Heart  disease Other        maternal grandmother and maternal grandfather   Prostate cancer Brother    Liver disease Brother     Past Surgical History:  Procedure Laterality Date   BACK SURGERY  1980's   BREAST BIOPSY Right    40 years ago- thinks right - benign   GALLBLADDER SURGERY  1990's   Social History   Occupational History   Not on file  Tobacco Use   Smoking status: Former    Packs/day: 1.00    Years: 30.00    Total pack years: 30.00    Types: Cigarettes    Quit date: 08/06/1997    Years since quitting: 24.5   Smokeless tobacco: Never  Substance and Sexual Activity   Alcohol use: No   Drug use: No   Sexual activity: Not on file

## 2022-04-06 ENCOUNTER — Other Ambulatory Visit: Payer: Self-pay | Admitting: Internal Medicine

## 2022-04-06 DIAGNOSIS — Z13820 Encounter for screening for osteoporosis: Secondary | ICD-10-CM

## 2022-04-21 ENCOUNTER — Ambulatory Visit
Admission: RE | Admit: 2022-04-21 | Discharge: 2022-04-21 | Disposition: A | Discharge status: Home / Self Care | Payer: Medicare HMO | Source: Ambulatory Visit | Attending: Internal Medicine | Admitting: Internal Medicine

## 2022-04-21 DIAGNOSIS — Z13820 Encounter for screening for osteoporosis: Secondary | ICD-10-CM

## 2022-05-04 ENCOUNTER — Encounter: Payer: Self-pay | Admitting: Orthopedic Surgery

## 2022-05-04 ENCOUNTER — Ambulatory Visit: Payer: Medicare HMO | Admitting: Orthopedic Surgery

## 2022-05-04 ENCOUNTER — Ambulatory Visit (INDEPENDENT_AMBULATORY_CARE_PROVIDER_SITE_OTHER): Payer: Medicare HMO

## 2022-05-04 DIAGNOSIS — M5441 Lumbago with sciatica, right side: Secondary | ICD-10-CM | POA: Diagnosis not present

## 2022-05-04 DIAGNOSIS — M79642 Pain in left hand: Secondary | ICD-10-CM | POA: Diagnosis not present

## 2022-05-04 DIAGNOSIS — M654 Radial styloid tenosynovitis [de Quervain]: Secondary | ICD-10-CM | POA: Diagnosis not present

## 2022-05-04 DIAGNOSIS — I714 Abdominal aortic aneurysm, without rupture, unspecified: Secondary | ICD-10-CM

## 2022-05-04 MED ORDER — PREDNISONE 10 MG PO TABS: 10.0000 mg | ORAL_TABLET | Freq: Every day | ORAL | 0 refills | Status: DC

## 2022-05-04 MED ORDER — LIDOCAINE HCL 1 % IJ SOLN
1.0000 mL | INTRAMUSCULAR | Status: AC | PRN
  Administered 2022-05-04: 1 mL

## 2022-05-04 MED ORDER — METHYLPREDNISOLONE ACETATE 40 MG/ML IJ SUSP
40.0000 mg | INTRAMUSCULAR | Status: AC | PRN
  Administered 2022-05-04: 40 mg

## 2022-05-04 NOTE — Progress Notes (Signed)
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  Patient presents with right-sided radicular symptoms.  Patient denies any recent injuries.  Patient also complains of pain over the first dorsal extensor compartment of the left wrist.  Assessment & Plan: Visit Diagnoses:  1. Acute right-sided low back pain with right-sided sciatica   2. Pain in left hand   3. Abdominal aortic aneurysm (AAA) without rupture, unspecified part ([place])     Plan: The first dorsal extensor compartment left wrist was injected recommend the patient obtain an over-the-counter wrist splint to provide her wrist better support.  She is provided a prescription for prednisone 10 mg with breakfast to help with the lumbar spine radicular symptoms.  We will set her up for an ultrasound of her abdomen to evaluate for possible aneurysm.  Follow-Up Instructions: Return if symptoms worsen or fail to improve.   Ortho Exam  Patient is alert, oriented, no adenopathy, well-dressed, normal affect, normal respiratory effort. Examination patient is slow to get from a sitting to a standing position she uses a cane with a kyphotic gait.  She states she is currently on Neurontin without relief.  She complains of pain in her back radiating down the right leg.  She has no focal motor weakness in the right lower extremity negative straight leg raise.  Examination of the left hand she has some tenderness to palpation over the scapholunate interval the TFCC is not tender to palpation she has first dorsal extensor pain with a  Finkelstein's test.  Imaging: XR Hand Complete Left  Result Date: 05/04/2022 Three-view radiographs of the left hand shows mild widening of the scapholunate interval  XR Lumbar Spine 2-3 Views  Result Date: 05/04/2022 2 view radiographs of the lumbar spine shows degenerative disc disease throughout the lumbar spine.  Patient has what appears to be an abdominal aortic aneurysm approximately 5 cm in diameter.  No images are attached to the encounter.  Labs: Lab Results  Component Value Date   HGBA1C 6.0 02/25/2012   REPTSTATUS 01/01/2009 FINAL 12/30/2008   REPTSTATUS 12/30/2008 FINAL 12/30/2008   REPTSTATUS 01/02/2009 FINAL 12/30/2008   CULT  12/30/2008    NO SALMONELLA, SHIGELLA, CAMPYLOBACTER, OR YERSINIA ISOLATED     Lab Results  Component Value Date   ALBUMIN 3.5 02/01/2014   ALBUMIN 3.1 (L) 01/03/2009   ALBUMIN 2.8 (L) 01/02/2009    No results found for: "MG" No results found for: "VD25OH"  No results found for: "PREALBUMIN"    Latest Ref Rng & Units 02/01/2014    8:34 PM 12/04/2011   12:00 AM 01/03/2009    5:35 AM  CBC EXTENDED  WBC 4.0 - 10.5 K/uL 6.5  6.7  7.7   RBC 3.87 - 5.11 MIL/uL 3.79   3.68   Hemoglobin 12.0 - 15.0 g/dL 12.2  14.0  12.0   HCT 36.0 - 46.0 %  36.5  41  35.5   Platelets 150 - 400 K/uL 245  328  280   NEUT# 1.7 - 7.7 K/uL 3.0     Lymph# 0.7 - 4.0 K/uL 2.8        There is no height or weight on file to calculate BMI.  Orders:  Orders Placed This Encounter  Procedures   XR Hand Complete Left   XR Lumbar Spine 2-3 Views   Meds ordered this encounter  Medications   predniSONE (DELTASONE) 10 MG tablet    Sig: Take 1 tablet (10 mg total) by mouth daily with breakfast.    Dispense:  30 tablet    Refill:  0     Procedures: Hand/UE Inj: L extensor compartment 1 for de Quervain's tenosynovitis on 05/04/2022 11:31 AM Indications: therapeutic and diagnostic Details: 22 G needle, dorsal approach Medications: 1 mL lidocaine 1 %; 40 mg  methylPREDNISolone acetate 40 MG/ML Outcome: tolerated well, no immediate complications Procedure, treatment alternatives, risks and benefits explained, specific risks discussed. Consent was given by the patient. Immediately prior to procedure a time out was called to verify the correct patient, procedure, equipment, support staff and site/side marked as required. Patient was prepped and draped in the usual sterile fashion.      Clinical Data: No additional findings.  ROS:  All other systems negative, except as noted in the HPI. Review of Systems  Objective: Vital Signs: There were no vitals taken for this visit.  Specialty Comments:  No specialty comments available.  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    Other bursitis disorders    Spinal stenosis, unspecified region other than cervical    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    Vertigo     Family History  Problem Relation Age of Onset   Allergies Other        brother, sister, daughter   Heart disease Other        maternal grandmother and maternal grandfather   Prostate cancer Brother    Liver disease Brother     Past Surgical History:  Procedure Laterality Date   BACK SURGERY  1980's   BREAST BIOPSY Right    40 years ago- thinks right - benign   GALLBLADDER SURGERY  1990's   Social History   Occupational History   Not on file  Tobacco Use   Smoking status: Former    Packs/day: 1.00    Years: 30.00    Total pack years: 30.00     Types: Cigarettes    Quit date: 08/06/1997    Years since quitting: 24.7   Smokeless tobacco: Never  Substance and Sexual Activity   Alcohol use: No   Drug use: No   Sexual activity: Not on file

## 2022-05-22 ENCOUNTER — Telehealth (HOSPITAL_COMMUNITY): Payer: Self-pay

## 2022-05-22 NOTE — Telephone Encounter (Signed)
unable to contact pt to schedule ordered studies, attempted contact dates : 05/05/22 05/12/22 05/22/22 per office protocol order to be canceled in 7 days if no contact is made.

## 2022-05-25 ENCOUNTER — Ambulatory Visit: Payer: Medicare HMO | Admitting: Orthopedic Surgery

## 2022-10-21 ENCOUNTER — Ambulatory Visit: Payer: Medicare HMO | Admitting: Family

## 2022-10-21 DIAGNOSIS — M5416 Radiculopathy, lumbar region: Secondary | ICD-10-CM

## 2022-10-21 DIAGNOSIS — M79642 Pain in left hand: Secondary | ICD-10-CM

## 2022-10-21 NOTE — Progress Notes (Signed)
Office Visit Note   Patient: Charlotte Whitehead           Date of Birth: 06/28/1940           MRN: [id number] Visit Date: 10/21/2022              Requested by: Gwenyth Bender, MD [address] [place],  [place] [address] PCP: Gwenyth Bender, MD  Chief Complaint  Patient presents with   Right Leg - Pain   Left Wrist - Pain      HPI: The patient is a 82 year old woman who presents today for 2 separate issues  #1 left wrist pain this is primarily along the radial aspect.  She reports associated tingling and shooting pain weakness unable to hold things or grip things in the left hand.  Reports she has previously had Depo-Medrol injections in the wrist which provided minimal relief  Complains also of right hip buttock and thigh pain she states that shoots from her hip down the back of her leg.  She has giving way at the knee with weightbearing this has been ongoing for months she has recently taken prednisone 10 mg which has provided her with minimal relief.  Assessment & Plan: Visit Diagnoses: No diagnosis found.  Plan: Will refer to Dr. Alvester Morin for both EMGs of the left upper extremity evaluate carpal tunnel syndrome.  As well as evaluate for epidural steroid injection has an MRI on file of the lumbar spine from 2022.  May need follow-up with hand surgery  Follow-Up Instructions: No follow-ups on file.   Back Exam   Muscle Strength  The patient has normal back strength.  Tests  Straight leg raise right: positive   Left Hand Exam   Tenderness  The patient is experiencing tenderness in the radial area.   Muscle Strength  Grip:  3/5   Tests  Phalen's sign: positive Tinel's sign (median nerve): positive  Other  Erythema: absent      Patient is alert, oriented, no adenopathy, well-dressed, normal affect, normal respiratory effort.   Imaging: No results found. No images are attached to the encounter.  Labs: Lab Results  Component Value Date   HGBA1C  6.0 02/25/2012   REPTSTATUS 01/01/2009 FINAL 12/30/2008   REPTSTATUS 12/30/2008 FINAL 12/30/2008   REPTSTATUS 01/02/2009 FINAL 12/30/2008   CULT  12/30/2008    NO SALMONELLA, SHIGELLA, CAMPYLOBACTER, OR YERSINIA ISOLATED     Lab Results  Component Value Date   ALBUMIN 3.5 02/01/2014   ALBUMIN 3.1 (L) 01/03/2009   ALBUMIN 2.8 (L) 01/02/2009    No results found for: "MG" No results found for: "VD25OH"  No results found for: "PREALBUMIN"    Latest Ref Rng & Units 02/01/2014    8:34 PM 12/04/2011   12:00 AM 01/03/2009    5:35 AM  CBC EXTENDED  WBC 4.0 - 10.5 K/uL 6.5  6.7  7.7   RBC 3.87 - 5.11 MIL/uL 3.79   3.68   Hemoglobin 12.0 - 15.0 g/dL 69.4  85.4  62.7   HCT 36.0 - 46.0 % 36.5  41  35.5   Platelets 150 - 400 K/uL 245  328  280   NEUT# 1.7 - 7.7 K/uL 3.0     Lymph# 0.7 - 4.0 K/uL 2.8        There is no height or weight on file to calculate BMI.  Orders:  No orders of the defined types were placed in this encounter.  No orders  of the defined types were placed in this encounter.    Procedures: No procedures performed  Clinical Data: No additional findings.  ROS:  All other systems negative, except as noted in the HPI. Review of Systems  Objective: Vital Signs: There were no vitals taken for this visit.  Specialty Comments:  No specialty comments available.  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    Other bursitis disorders    Spinal stenosis, unspecified  region other than cervical    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    Vertigo     Family History  Problem Relation Age of Onset   Allergies Other        brother, sister, daughter   Heart disease Other        maternal grandmother and maternal grandfather   Prostate cancer Brother    Liver disease Brother     Past Surgical History:  Procedure Laterality Date   BACK SURGERY  1980's   BREAST BIOPSY Right    40 years ago- thinks right - benign   GALLBLADDER SURGERY  1990's   Social History   Occupational History   Not on file  Tobacco Use   Smoking status: Former    Current packs/day: 0.00    Average packs/day: 1 pack/day for 30.0 years (30.0 ttl pk-yrs)    Types: Cigarettes    Start date: 08/07/1967    Quit date: 08/06/1997    Years since quitting: 25.2   Smokeless tobacco: Never  Substance and Sexual Activity   Alcohol use: No   Drug use: No   Sexual activity: Not on file

## 2022-11-02 ENCOUNTER — Telehealth: Payer: Self-pay | Admitting: Physical Medicine and Rehabilitation

## 2022-11-02 NOTE — Telephone Encounter (Signed)
PT would like to reschedule her 11/04/2022 appt at 10:30am to Friday 11/06/2022 at 10:15am. Due to her returning back from out of town.

## 2022-11-02 NOTE — Telephone Encounter (Signed)
Spoke with patient and rescheduled NCV for 11/06/22.

## 2022-11-04 ENCOUNTER — Encounter: Payer: Medicare HMO | Admitting: Physical Medicine and Rehabilitation

## 2022-11-06 ENCOUNTER — Ambulatory Visit: Payer: Medicare HMO | Admitting: Physical Medicine and Rehabilitation

## 2022-11-06 DIAGNOSIS — M19049 Primary osteoarthritis, unspecified hand: Secondary | ICD-10-CM | POA: Diagnosis not present

## 2022-11-06 DIAGNOSIS — M654 Radial styloid tenosynovitis [de Quervain]: Secondary | ICD-10-CM | POA: Diagnosis not present

## 2022-11-06 DIAGNOSIS — M79642 Pain in left hand: Secondary | ICD-10-CM

## 2022-11-06 DIAGNOSIS — R202 Paresthesia of skin: Secondary | ICD-10-CM

## 2022-11-06 MED ORDER — MELOXICAM 15 MG PO TABS: 15.0000 mg | ORAL_TABLET | Freq: Every day | ORAL | 0 refills | Status: DC

## 2022-11-06 NOTE — Progress Notes (Unsigned)
Functional Pain Scale - descriptive words and definitions  Intense (8)    Cannot complete any ADLs without much assistance/cannot concentrate/conversation is difficult/unable to sleep and unable to use distraction. Severe range order  Average Pain 7  LUE NCS- Pt has numbness, tingling, spasms and swelling in the left hand. Feels some of the pain up her arm. Has difficulty writing, washing and grasping.

## 2022-11-06 NOTE — Progress Notes (Unsigned)
Charlotte Whitehead - 82 y.o. female MRN [id number]  Date of birth: August 25, 1940  Office Visit Note: Visit Date: 11/06/2022 PCP: Gwenyth Bender, MD Referred by: Adonis Huguenin, NP  Subjective: Chief Complaint  Patient presents with   Left Hand - Pain, Numbness, Weakness, Tingling   HPI: Charlotte Whitehead is a 82 y.o. female who comes in today at the request of Barnie Del, FNP for evaluation and management of chronic, worsening and severe pain, numbness and tingling in the Left upper extremities.  Patient is Left hand dominant. Voltaren gel, meloxicam short course.    {Oswestry Disability Score:26558}  ROS Otherwise per HPI.  Assessment & Plan: Visit Diagnoses:    ICD-10-CM   1. Paresthesia of skin  R20.2 NCV with EMG (electromyography)       Plan: No additional findings.   Meds & Orders:  Meds ordered this encounter  Medications   meloxicam (MOBIC) 15 MG tablet    Sig: Take 1 tablet (15 mg total) by mouth daily. Take with food    Dispense:  30 tablet    Refill:  0    Orders Placed This Encounter  Procedures   NCV with EMG (electromyography)    Follow-up: No follow-ups on file.   Procedures: No procedures performed      Clinical History: No specialty comments available.   She reports that she quit smoking about 25 years ago. Her smoking use included cigarettes. She started smoking about 55 years ago. She has a 30 pack-year smoking history. She has never used smokeless tobacco. No results for input(s): "HGBA1C", "LABURIC" in the last 8760 hours.  Objective:  VS:  HT:    WT:   BMI:     BP:   HR: bpm  TEMP: ( )  RESP:  Physical Exam  Ortho Exam  Imaging: No results found.  Past Medical/Family/Surgical/Social History: Medications & Allergies reviewed per EMR, new medications updated. 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    Other bursitis disorders    Spinal stenosis, unspecified region other than cervical    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    Vertigo    Family History  Problem Relation Age of Onset   Allergies Other        brother, sister, daughter   Heart disease Other        maternal grandmother and maternal grandfather   Prostate cancer Brother    Liver disease Brother    Past Surgical History:  Procedure Laterality Date   BACK SURGERY  1980's   BREAST BIOPSY Right    40 years ago- thinks right - benign   GALLBLADDER SURGERY  1990's   Social History   Occupational History   Not on file  Tobacco Use   Smoking status: Former    Current packs/day: 0.00    Average packs/day: 1 pack/day for 30.0 years (30.0 ttl pk-yrs)    Types: Cigarettes    Start date: 08/07/1967    Quit date: 08/06/1997  Years since quitting: 25.2   Smokeless tobacco: Never  Substance and Sexual Activity   Alcohol use: No   Drug use: No   Sexual activity: Not on file

## 2022-11-07 ENCOUNTER — Encounter: Payer: Self-pay | Admitting: Physical Medicine and Rehabilitation

## 2022-11-07 NOTE — Procedures (Signed)
EMG & NCV Findings: Evaluation of the left median motor nerve showed reduced amplitude (3.2 mV).  The left median (across palm) sensory nerve showed no response (Palm) and prolonged distal peak latency (3.9 ms).  The left ulnar sensory nerve showed prolonged distal peak latency (3.8 ms) and decreased conduction velocity (Wrist-5th Digit, 37 m/s).  All remaining nerves (as indicated in the following tables) were within normal limits.    All examined muscles (as indicated in the following table) showed no evidence of electrical instability.    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   There is no significant electrodiagnostic evidence of any other focal nerve entrapment, brachial plexopathy or cervical radiculopathy.   This mild neuropathy would not explain her current symptoms.  Clinically seems to be more tendinitis or arthritis particularly may be a de Quervain's tenosynovitis.  Recommendations: 1.  Follow-up with referring physician.  I prescribed a short course of meloxicam for the next couple of weeks along with using Voltaren gel 3 times per day.  2.  Continue current management of symptoms.  ___________________________ Naaman Plummer FAAPMR Board Certified, American Board of Physical Medicine and Rehabilitation    Nerve Conduction Studies Anti Sensory Summary Table   Stim Site NR Peak (ms) Norm Peak (ms) P-T Amp (V) Norm P-T Amp Site1 Site2 Delta-P (ms) Dist (cm) Vel (m/s) Norm Vel (m/s)  Left Median Acr Palm Anti Sensory (2nd Digit)  29.9C  Wrist    *3.9 <3.6 25.9 >10 Wrist Palm  0.0    Palm *NR  <2.0          Left Radial Anti Sensory (Base 1st Digit)  29.7C  Wrist    2.7 <3.1 30.5  Wrist Base 1st Digit 2.7 0.0    Left Ulnar Anti Sensory (5th Digit)  29.9C  Wrist    *3.8 <3.7 17.5 >15.0 Wrist 5th Digit 3.8 14.0 *37 >38   Motor  Summary Table   Stim Site NR Onset (ms) Norm Onset (ms) O-P Amp (mV) Norm O-P Amp Site1 Site2 Delta-0 (ms) Dist (cm) Vel (m/s) Norm Vel (m/s)  Left Median Motor (Abd Poll Brev)  29.6C  Wrist    3.8 <4.2 *3.2 >5 Elbow Wrist 4.2 21.0 50 >50  Elbow    8.0  3.0         Left Ulnar Motor (Abd Dig Min)  29.5C  Wrist    3.2 <4.2 8.0 >3 B Elbow Wrist 3.4 19.0 56 >53  B Elbow    6.6  1.9  A Elbow B Elbow 1.9 10.0 53 >53  A Elbow    8.5  3.8          EMG   Side Muscle Nerve Root Ins Act Fibs Psw Amp Dur Poly Recrt Int Dennie Bible Comment  Left Abd Poll Brev Median C8-T1 Nml Nml Nml Nml Nml 0 Nml Nml   Left 1stDorInt Ulnar C8-T1 Nml Nml Nml Nml Nml 0 Nml Nml   Left PronatorTeres Median C6-7 Nml Nml Nml Nml Nml 0 Nml Nml   Left Biceps Musculocut C5-6 Nml Nml Nml Nml Nml 0 Nml Nml   Left Deltoid Axillary C5-6 Nml Nml Nml Nml Nml 0 Nml Nml     Nerve Conduction Studies Anti Sensory Left/Right Comparison   Stim Site L Lat (ms) R Lat (ms) L-R Lat (ms) L Amp (V) R Amp (V) L-R Amp (%) Site1  Site2 L Vel (m/s) R Vel (m/s) L-R Vel (m/s)  Median Acr Palm Anti Sensory (2nd Digit)  29.9C  Wrist *3.9   25.9   Wrist Palm     Palm             Radial Anti Sensory (Base 1st Digit)  29.7C  Wrist 2.7   30.5   Wrist Base 1st Digit     Ulnar Anti Sensory (5th Digit)  29.9C  Wrist *3.8   17.5   Wrist 5th Digit *37     Motor Left/Right Comparison   Stim Site L Lat (ms) R Lat (ms) L-R Lat (ms) L Amp (mV) R Amp (mV) L-R Amp (%) Site1 Site2 L Vel (m/s) R Vel (m/s) L-R Vel (m/s)  Median Motor (Abd Poll Brev)  29.6C  Wrist 3.8   *3.2   Elbow Wrist 50    Elbow 8.0   3.0         Ulnar Motor (Abd Dig Min)  29.5C  Wrist 3.2   8.0   B Elbow Wrist 56    B Elbow 6.6   1.9   A Elbow B Elbow 53    A Elbow 8.5   3.8            Waveforms:

## 2022-11-11 ENCOUNTER — Encounter: Payer: Self-pay | Admitting: Physical Medicine and Rehabilitation

## 2022-11-11 ENCOUNTER — Ambulatory Visit: Payer: Medicare HMO | Admitting: Physical Medicine and Rehabilitation

## 2022-11-11 DIAGNOSIS — M5442 Lumbago with sciatica, left side: Secondary | ICD-10-CM | POA: Diagnosis not present

## 2022-11-11 DIAGNOSIS — G894 Chronic pain syndrome: Secondary | ICD-10-CM

## 2022-11-11 DIAGNOSIS — M5441 Lumbago with sciatica, right side: Secondary | ICD-10-CM

## 2022-11-11 DIAGNOSIS — M5416 Radiculopathy, lumbar region: Secondary | ICD-10-CM

## 2022-11-11 DIAGNOSIS — R269 Unspecified abnormalities of gait and mobility: Secondary | ICD-10-CM | POA: Diagnosis not present

## 2022-11-11 DIAGNOSIS — G8929 Other chronic pain: Secondary | ICD-10-CM

## 2022-11-11 NOTE — Progress Notes (Signed)
Charlotte Whitehead - 82 y.o. female MRN [id number]  Date of birth: [birth year]  Office Visit Note: Visit Date: 11/11/2022 PCP: Gwenyth Bender, MD Referred by: Gwenyth Bender, MD  Subjective: Chief Complaint  Patient presents with   Lower Back - Pain   HPI: Charlotte Whitehead is a 82 y.o. female who comes in today per the request of Barnie Del, NP for evaluation of chronic, worsening and severe bilateral lower back pain radiating down lateral legs to feet, right greater than left. Pain ongoing for over 1 year, worsens with sitting and when moving from sitting to standing position. She describes her pain as sharp and shooting sensation, currently rates as 7 out of 10. Some relief of pain with home exercise regimen, heating pad, rest and use of medications. She is prescribed 120 tablets of 5-325 mg Norco by her primary care provider Dr. Willey Blade, however she did not receive prescription for the month of August per PDMP. Prior regimen of formal physical therapy helped some to alleviate pain. Lumbar MRI imaging from 2022 exhibits multi level degenerative changes. There is also facet arthropathy, moderate-severe left neuroforaminal narrowing and mild to moderate right neural foraminal narrowing. No high grade spinal canal stenosis noted. Patient was seen by Dr. Romero Belling with [facility] in 2022, per patient he did recommend lumbar epidural steroid injection, however she declined this procedure at that time. History of frequent falls, does use cane to assist with ambulation. Patient denies focal weakness, numbness and tingling. No recent trauma or falls.       Review of Systems  Musculoskeletal:  Positive for back pain and myalgias.  Neurological:  Negative for tingling, sensory change, focal weakness and weakness.  All other systems reviewed and are negative.  Otherwise per HPI.  Assessment & Plan: Visit Diagnoses:    ICD-10-CM   1. Lumbar radiculopathy  M54.16     2. Chronic pain  syndrome  G89.4     3. Chronic bilateral low back pain with bilateral sciatica  M54.42    M54.41    G89.29     4. Gait abnormality  R26.9        Plan: Findings:  Chronic, worsening and severe bilateral lower back pain radiating down both lateral legs to feet, right greater than left. Patient continues to have severe pain despite good conservative therapies such as home exercise regimen, rest and use of medications. Patients clinical presentation and exam are consistent with L5 nerve pattern. I also feel there could be type of chronic pain/myofascial component contributing to her symptoms. We discussed treatment plan in detail today including lumbar epidural steroid injection procedure. She does not wish to undergo injection at this time, would prefer to re-group with formal physical therapy. I also encouraged her to call PCP to discuss issues with Norco. If her pain persists would consider performing injection vs obtaining new lumbar MRI imaging. Patient has no questions at this time. We are happy to see her back as needed. No red flag symptoms noted upon exam today.     Meds & Orders: No orders of the defined types were placed in this encounter.  No orders of the defined types were placed in this encounter.   Follow-up: Return if symptoms worsen or fail to improve.   Procedures: No procedures performed      Clinical History: CLINICAL DATA:  History of lower back pain   EXAM: MRI LUMBAR SPINE WITHOUT CONTRAST   TECHNIQUE: Multiplanar, multisequence MR  imaging of the lumbar spine was performed. No intravenous contrast was administered.   COMPARISON:  MRI lumbar spine 01/21/2009   FINDINGS: Segmentation:  Standard   Alignment:  Physiologic   Vertebrae: Maintained vertebral body heights. There is multilevel disc desiccation. Multilevel facet arthritis, mild.   Conus medullaris and cauda equina: Conus extends to the L1-L2 level. Conus and cauda equina appear normal.    Paraspinal and other soft tissues: Aortic atherosclerotic disease, otherwise unremarkable.   Disc levels:   T12-L1: Minimal disc bulge. No significant spinal canal or neural foraminal narrowing.   L1-L2: No significant spinal canal or neural foraminal narrowing.   L2-L3: No significant spinal canal or neural foraminal narrowing.   L3-L4: Minimal disc bulge with mild bilateral neural foraminal narrowing.   L4-L5: Minimal disc bulge with mild bilateral neural foraminal narrowing.   L5-S1: Asymmetric left disc bulging and facet arthropathy results in moderate-severe left and mild-to-moderate right neural foraminal narrowing.   IMPRESSION: Asymmetric left disc bulging and facet arthropathy at L5-S1 resulting in moderate-severe left neuroforaminal narrowing and mild to moderate right neural foraminal narrowing. Additional mild degenerative changes above this level as described.     Electronically Signed   By: Caprice Renshaw   On: 07/22/2020 14:24   She reports that she quit smoking about 25 years ago. Her smoking use included cigarettes. She started smoking about 55 years ago. She has a 30 pack-year smoking history. She has never used smokeless tobacco. No results for input(s): "HGBA1C", "LABURIC" in the last 8760 hours.  Objective:  VS:  HT:    WT:   BMI:     BP:   HR: bpm  TEMP: ( )  RESP:  Physical Exam Vitals and nursing note reviewed.  HENT:     Head: Normocephalic and atraumatic.     Right Ear: External ear normal.     Left Ear: External ear normal.     Nose: Nose normal.     Mouth/Throat:     Mouth: Mucous membranes are moist.  Eyes:     Extraocular Movements: Extraocular movements intact.  Cardiovascular:     Rate and Rhythm: Normal rate.     Pulses: Normal pulses.  Pulmonary:     Effort: Pulmonary effort is normal.  Abdominal:     General: Abdomen is flat. There is no distension.  Musculoskeletal:        General: Tenderness present.     Cervical back:  Normal range of motion.     Comments: Patient is slow to rise from seated to standing position. Good lumbar range of motion. No pain noted with facet loading. 5/5 strength noted with bilateral hip flexion, knee flexion/extension, ankle dorsiflexion/plantarflexion and EHL. No clonus noted bilaterally. No pain upon palpation of greater trochanters. No pain with internal/external rotation of bilateral hips. Sensation intact bilaterally. Tenderness noted upon palpation of bilateral lumbar paraspinal regions. Negative slump test bilaterally. Ambulates with cane, gait slow and unsteady.   Skin:    General: Skin is warm and dry.     Capillary Refill: Capillary refill takes less than 2 seconds.  Neurological:     General: No focal deficit present.     Mental Status: She is alert and oriented to person, place, and time.  Psychiatric:        Mood and Affect: Mood normal.        Behavior: Behavior normal.     Ortho Exam  Imaging: No results found.  Past Medical/Family/Surgical/Social History: Medications & Allergies  reviewed per EMR, new medications updated. 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    Other bursitis disorders    Spinal stenosis, unspecified region other than cervical    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    Vertigo    Family History  Problem Relation Age of Onset   Allergies Other        brother, sister, daughter   Heart disease Other        maternal grandmother and maternal grandfather   Prostate cancer  Brother    Liver disease Brother    Past Surgical History:  Procedure Laterality Date   BACK SURGERY  1980's   BREAST BIOPSY Right    40 years ago- thinks right - benign   GALLBLADDER SURGERY  1990's   Social History   Occupational History   Not on file  Tobacco Use   Smoking status: Former    Current packs/day: 0.00    Average packs/day: 1 pack/day for 30.0 years (30.0 ttl pk-yrs)    Types: Cigarettes    Start date: 08/07/1967    Quit date: 08/06/1997    Years since quitting: 25.2   Smokeless tobacco: Never  Substance and Sexual Activity   Alcohol use: No   Drug use: No   Sexual activity: Not on file

## 2022-11-11 NOTE — Progress Notes (Signed)
Functional Pain Scale - descriptive words and definitions  Moderate (4)   Constantly aware of pain, can complete ADLs with modification/sleep marginally affected at times/passive distraction is of no use, but active distraction gives some relief. Moderate range order  Average Pain 6  Lower back pain on right side that radiate into the back of the right leg to the ankle. Walking and standing makes pain worse

## 2022-11-25 ENCOUNTER — Ambulatory Visit: Payer: Medicare HMO | Admitting: Orthopedic Surgery

## 2022-11-25 DIAGNOSIS — G5602 Carpal tunnel syndrome, left upper limb: Secondary | ICD-10-CM

## 2022-11-25 DIAGNOSIS — M654 Radial styloid tenosynovitis [de Quervain]: Secondary | ICD-10-CM | POA: Diagnosis not present

## 2022-11-25 MED ORDER — BETAMETHASONE SOD PHOS & ACET 6 (3-3) MG/ML IJ SUSP
6.0000 mg | INTRAMUSCULAR | Status: AC | PRN
  Administered 2022-11-25: 6 mg via INTRA_ARTICULAR

## 2022-11-25 MED ORDER — LIDOCAINE HCL 1 % IJ SOLN
1.0000 mL | INTRAMUSCULAR | Status: AC | PRN
  Administered 2022-11-25: 1 mL

## 2022-11-25 NOTE — Progress Notes (Signed)
Charlotte Whitehead - 82 y.o. female MRN [id number]  Date of birth: 07/11/1940  Office Visit Note: Visit Date: 11/25/2022 PCP: Gwenyth Bender, MD Referred by: Gwenyth Bender, MD  Subjective: Chief Complaint  Patient presents with   Lower Back - Follow-up   HPI: Charlotte Whitehead is a pleasant 82 y.o. female who presents today for evaluation of ongoing left radial sided wrist pain with associated numbness and tingling of the radial sided digits.  States that the symptoms have been present for multiple years, have worsened more recently in nature.  Does have an electrodiagnostic study from August of this year which does confirm median nerve entrapment at the left wrist affecting sensory components.  Has been trialing meloxicam and Voltaren gel as well for the left wrist without lasting relief.  No prior bracing, no prior injections.  Pertinent ROS were reviewed with the patient and found to be negative unless otherwise specified above in HPI.   Visit Reason: left wrist Duration of symptoms: years Hand dominance: left Occupation: retired Diabetic: No Heart/Lung History: none Blood Thinners:  baby aspirin  Prior Testing/EMG: EMG 11/06/22 Injections (Date): 05/04/22 Treatments: injection Prior Surgery: none  Assessment & Plan: Visit Diagnoses: No diagnosis found.  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  I reviewed the results of her electrodiagnostic study which do show carpal tunnel syndrome affecting sensory components.  We discussed treatment options for both de Quervain's tenosynovitis as well as carpal tunnel syndrome ranging from conservative to surgical.  Given that she has trialed medications, activity modification and topical Voltaren without significant relief for the wrist extensor tenosynovitis, she is an appropriate candidate for cortisone  injection to the left wrist first extensor compartment for symptom relief.  Will also have her fitted for a Comfort Cool brace today.  As for the carpal tunnel syndrome, she does have clinical and electrodiagnostic evidence to confirm this diagnosis.  I will have her follow-up in approximate 6 weeks for the left wrist first extensor compartment tenosynovitis status post injection to monitor for symptom relief, if her symptoms have not relieved at that point from conservative measures, then we can consider surgical discussion for left carpal tunnel release and first extensor compartment release to be performed together.  She expressed full understanding.    Follow-up: Return in about 6 weeks (around 01/06/2023) for Recheck de Quervain's and carpal tunnel syndrome.   Meds & Orders: No orders of the defined types were placed in this encounter.   Orders Placed This Encounter  Procedures   Hand/UE Inj     Procedures: Hand/UE Inj: L extensor compartment 1 for de Quervain's tenosynovitis on 11/25/2022 10:40 AM Details: 25 G needle Medications: 1 mL lidocaine 1 %; 6 mg betamethasone acetate-betamethasone sodium phosphate 6 (3-3) MG/ML Outcome: tolerated well, no immediate complications Procedure, treatment alternatives, risks and benefits explained, specific risks discussed. Consent was given by the patient.          Clinical History: CLINICAL DATA:  History of lower back pain   EXAM: MRI LUMBAR SPINE WITHOUT CONTRAST   TECHNIQUE: Multiplanar, multisequence MR imaging of the lumbar spine was performed. No intravenous contrast was administered.   COMPARISON:  MRI lumbar spine 01/21/2009   FINDINGS: Segmentation:  Standard   Alignment:  Physiologic   Vertebrae: Maintained vertebral body heights. There is multilevel disc desiccation. Multilevel facet arthritis, mild.   Conus medullaris  and cauda equina: Conus extends to the L1-L2 level. Conus and cauda equina appear normal.    Paraspinal and other soft tissues: Aortic atherosclerotic disease, otherwise unremarkable.   Disc levels:   T12-L1: Minimal disc bulge. No significant spinal canal or neural foraminal narrowing.   L1-L2: No significant spinal canal or neural foraminal narrowing.   L2-L3: No significant spinal canal or neural foraminal narrowing.   L3-L4: Minimal disc bulge with mild bilateral neural foraminal narrowing.   L4-L5: Minimal disc bulge with mild bilateral neural foraminal narrowing.   L5-S1: Asymmetric left disc bulging and facet arthropathy results in moderate-severe left and mild-to-moderate right neural foraminal narrowing.   IMPRESSION: Asymmetric left disc bulging and facet arthropathy at L5-S1 resulting in moderate-severe left neuroforaminal narrowing and mild to moderate right neural foraminal narrowing. Additional mild degenerative changes above this level as described.     Electronically Signed   By: Caprice Renshaw   On: 07/22/2020 14:24  She reports that she quit smoking about 25 years ago. Her smoking use included cigarettes. She started smoking about 55 years ago. She has a 30 pack-year smoking history. She has never used smokeless tobacco. No results for input(s): "HGBA1C", "LABURIC" in the last 8760 hours.  Objective:   Vital Signs: There were no vitals taken for this visit.  Physical Exam  Gen: Well-appearing, in no acute distress; non-toxic CV: Regular Rate. Well-perfused. Warm.  Resp: Breathing unlabored on room air; no wheezing. Psych: Fluid speech in conversation; appropriate affect; normal thought process  Ortho Exam PHYSICAL EXAM:  General: Patient is well appearing and in no distress. Cervical spine mobility is full in all directions:  Skin and Muscle: No significant skin changes are apparent to upper extremities.   Range of Motion and Palpation Tests: Mobility is full about the elbows with flexion and extension. Forearm supination and pronation  are 85/85 bilaterally.  Wrist flexion/extension is 75/65 bilaterally.  Digital flexion and extension are full.  Thumb opposition is full to the base of the small fingers bilaterally.    No cords or nodules are palpated.  No triggering is observed.    Left wrist: Moderate tenderness over the left thumb CMC articulation is observed.  CMC grind is positive for crepitus.  Scaphoid shift test is positive clunk minimal pain.  Finklestein test is positive.  Ulnar impingement test is negative.  No evidence of radiocarpal, midcarpal or intercarpal joint instability with provocation.  Neurologic, Vascular, Motor: Left Sensation is diminished to light touch in the left median nerve distribution    Tinel's testing positive left carpal tunnel Phalen's positive left, Derkan's compression positive left Thenar atrophy: Negative APB: 5/5  Fingers pink and well perfused.  Capillary refill is brisk.     Lab Results  Component Value Date   HGBA1C 6.0 02/25/2012     Imaging: No results found.  Past Medical/Family/Surgical/Social History: Medications & Allergies reviewed per EMR, new medications updated. 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    Other bursitis disorders  Spinal stenosis, unspecified region other than cervical    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    Vertigo    Family History  Problem Relation Age of Onset   Allergies Other        brother, sister, daughter   Heart disease Other        maternal grandmother and  maternal grandfather   Prostate cancer Brother    Liver disease Brother    Past Surgical History:  Procedure Laterality Date   BACK SURGERY  1980's   BREAST BIOPSY Right    40 years ago- thinks right - benign   GALLBLADDER SURGERY  1990's   Social History   Occupational History   Not on file  Tobacco Use   Smoking status: Former    Current packs/day: 0.00    Average packs/day: 1 pack/day for 30.0 years (30.0 ttl pk-yrs)    Types: Cigarettes    Start date: 08/07/1967    Quit date: 08/06/1997    Years since quitting: 25.3   Smokeless tobacco: Never  Substance and Sexual Activity   Alcohol use: No   Drug use: No   Sexual activity: Not on file    Trishna Cwik Trevor Mace, M.D. [facility] 10:46 AM

## 2022-12-07 ENCOUNTER — Other Ambulatory Visit: Payer: Self-pay | Admitting: Physical Medicine and Rehabilitation

## 2023-01-04 ENCOUNTER — Ambulatory Visit: Payer: Medicare HMO | Admitting: Orthopedic Surgery

## 2023-01-04 DIAGNOSIS — M654 Radial styloid tenosynovitis [de Quervain]: Secondary | ICD-10-CM | POA: Diagnosis not present

## 2023-01-04 DIAGNOSIS — G5602 Carpal tunnel syndrome, left upper limb: Secondary | ICD-10-CM | POA: Diagnosis not present

## 2023-01-05 NOTE — Progress Notes (Signed)
Charlotte Whitehead - 82 y.o. female MRN [id number]  Date of birth: [birth year]  Office Visit Note: Visit Date: 01/04/2023 PCP: Gwenyth Bender, MD Referred by: Gwenyth Bender, MD  Subjective: No chief complaint on file.  HPI: Charlotte Whitehead is a pleasant 82 y.o. female who presents today for follow up of ongoing left radial sided wrist pain with associated numbness and tingling of the radial sided digits.  She underwent injection to the left wrist first extensor compartment at her prior visit which did provide significant relief.  Her numbness and tingling remains somewhat persistent.  Assessment & Plan: Visit Diagnoses: No diagnosis found.  Plan: Injection to the left wrist first extensor compartment has provided significant relief.  I did explain that should her symptoms recur, we can consider the possibility of repeat injection versus potential first extensor compartment release.  As for the carpal tunnel syndrome, she does have clinical and electrodiagnostic evidence to confirm this diagnosis.  Her symptoms are mild and intermittent at this point, I have recommended that she transition to a removable wrist brace particular for nocturnal symptoms at this juncture.  Once again, should her symptoms become refractory to conservative care, she should return in near future for discussion regarding potential carpal tunnel release.  She expressed understanding.   Follow-up: No follow-ups on file.   Meds & Orders: No orders of the defined types were placed in this encounter.   No orders of the defined types were placed in this encounter.    Procedures: No procedures performed      Clinical History: CLINICAL DATA:  History of lower back pain   EXAM: MRI LUMBAR SPINE WITHOUT CONTRAST   TECHNIQUE: Multiplanar, multisequence MR imaging of the lumbar spine was performed. No intravenous contrast was administered.   COMPARISON:  MRI lumbar spine 01/21/2009   FINDINGS: Segmentation:   Standard   Alignment:  Physiologic   Vertebrae: Maintained vertebral body heights. There is multilevel disc desiccation. Multilevel facet arthritis, mild.   Conus medullaris and cauda equina: Conus extends to the L1-L2 level. Conus and cauda equina appear normal.   Paraspinal and other soft tissues: Aortic atherosclerotic disease, otherwise unremarkable.   Disc levels:   T12-L1: Minimal disc bulge. No significant spinal canal or neural foraminal narrowing.   L1-L2: No significant spinal canal or neural foraminal narrowing.   L2-L3: No significant spinal canal or neural foraminal narrowing.   L3-L4: Minimal disc bulge with mild bilateral neural foraminal narrowing.   L4-L5: Minimal disc bulge with mild bilateral neural foraminal narrowing.   L5-S1: Asymmetric left disc bulging and facet arthropathy results in moderate-severe left and mild-to-moderate right neural foraminal narrowing.   IMPRESSION: Asymmetric left disc bulging and facet arthropathy at L5-S1 resulting in moderate-severe left neuroforaminal narrowing and mild to moderate right neural foraminal narrowing. Additional mild degenerative changes above this level as described.     Electronically Signed   By: Charlotte Renshaw   On: 07/22/2020 14:24  She reports that she quit smoking about 25 years ago. Her smoking use included cigarettes. She started smoking about 55 years ago. She has a 30 pack-year smoking history. She has never used smokeless tobacco. No results for input(s): "HGBA1C", "LABURIC" in the last 8760 hours.  Objective:   Vital Signs: There were no vitals taken for this visit.  Physical Exam  Gen: Well-appearing, in no acute distress; non-toxic CV: Regular Rate. Well-perfused. Warm.  Resp: Breathing unlabored on room air; no wheezing. Psych: Fluid speech in  conversation; appropriate affect; normal thought process  Ortho Exam PHYSICAL EXAM:  General: Patient is well appearing and in no distress.  Cervical spine mobility is full in all directions:  Skin and Muscle: No significant skin changes are apparent to upper extremities.   Range of Motion and Palpation Tests: Mobility is full about the elbows with flexion and extension. Forearm supination and pronation are 85/85 bilaterally.  Wrist flexion/extension is 75/65 bilaterally.  Digital flexion and extension are full.  Thumb opposition is full to the base of the small fingers bilaterally.    No cords or nodules are palpated.  No triggering is observed.    Left wrist: Moderate tenderness over the left thumb CMC articulation is observed.  CMC grind is positive for crepitus.  Scaphoid shift test is positive clunk minimal pain.  Finklestein test is positive.  Ulnar impingement test is negative.  No evidence of radiocarpal, midcarpal or intercarpal joint instability with provocation.  Neurologic, Vascular, Motor: Left Sensation is diminished to light touch in the left median nerve distribution    Tinel's testing positive left carpal tunnel Phalen's positive left, Derkan's compression positive left Thenar atrophy: Negative APB: 5/5  Fingers pink and well perfused.  Capillary refill is brisk.     Lab Results  Component Value Date   HGBA1C 6.0 02/25/2012     Imaging: No results found.  Past Medical/Family/Surgical/Social History: Medications & Allergies reviewed per EMR, new medications updated. 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    Other  bursitis disorders    Spinal stenosis, unspecified region other than cervical    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    Vertigo    Family History  Problem Relation Age of Onset   Allergies Other        brother, sister, daughter   Heart disease Other        maternal grandmother and maternal grandfather   Prostate cancer Brother    Liver disease Brother    Past Surgical History:  Procedure Laterality Date   BACK SURGERY  1980's   BREAST BIOPSY Right    40 years ago- thinks right - benign   GALLBLADDER SURGERY  1990's   Social History   Occupational History   Not on file  Tobacco Use   Smoking status: Former    Current packs/day: 0.00    Average packs/day: 1 pack/day for 30.0 years (30.0 ttl pk-yrs)    Types: Cigarettes    Start date: 08/07/1967    Quit date: 08/06/1997    Years since quitting: 25.4   Smokeless tobacco: Never  Substance and Sexual Activity   Alcohol use: No   Drug use: No   Sexual activity: Not on file    Joan Herschberger Trevor Mace, M.D. [facility] 9:19 PM

## 2023-02-15 ENCOUNTER — Ambulatory Visit: Payer: Medicare HMO | Admitting: Orthopedic Surgery

## 2023-02-15 DIAGNOSIS — G5602 Carpal tunnel syndrome, left upper limb: Secondary | ICD-10-CM

## 2023-02-15 MED ORDER — BETAMETHASONE SOD PHOS & ACET 6 (3-3) MG/ML IJ SUSP
6.0000 mg | INTRAMUSCULAR | Status: AC | PRN
Start: 2023-02-15 — End: 2023-02-15
  Administered 2023-02-15: 6 mg via INTRA_ARTICULAR

## 2023-02-15 MED ORDER — LIDOCAINE HCL 1 % IJ SOLN
1.0000 mL | INTRAMUSCULAR | Status: AC | PRN
Start: 2023-02-15 — End: 2023-02-15
  Administered 2023-02-15: 1 mL

## 2023-02-15 NOTE — Progress Notes (Signed)
Charlotte Whitehead - 82 y.o. female MRN [id number]  Date of birth: 10/13/1940  Office Visit Note: Visit Date: 02/15/2023 PCP: Charlotte Bender, MD Referred by: Charlotte Bender, MD  Subjective: No chief complaint on file.  HPI: Charlotte Whitehead is a pleasant 82 y.o. female who presents today for follow up of ongoing left radial sided wrist pain with associated numbness and tingling of the radial sided digits.  She underwent injection to the left wrist first extensor compartment in the past which has helped mitigate her symptoms.  Her main concern at this point is the numbness and tingling of the radial sided digits.  She has been trialing a wrist brace with mild relief, particular for nocturnal symptoms.  Assessment & Plan: Visit Diagnoses:  1. Carpal tunnel syndrome, left upper limb     Plan: I am glad to see that conservative care is still continued to help her ongoing left wrist de Quervain's tenosynovitis.  As for the carpal tunnel syndrome, she does have clinical and electrodiagnostic evidence to confirm this diagnosis.  Her symptoms are mild to moderate at this juncture, slightly worse than her most recent visit.  Cortisone injection today was provided to the left carpal tunnel for symptom relief.  This would be helpful for both diagnostic and therapeutic purposes.  She will return in 6 weeks for a recheck.  At that juncture, should her symptoms remain refractory to conservative care, we could consider potential left carpal tunnel release.  Follow-up: No follow-ups on file.   Meds & Orders: No orders of the defined types were placed in this encounter.   Orders Placed This Encounter  Procedures   Ambulatory referral to Occupational Therapy     Procedures: Hand/UE Inj: L carpal tunnel for carpal tunnel syndrome on 02/15/2023 10:07 PM Details: 25 G needle Medications: 1 mL lidocaine 1 %; 6 mg betamethasone acetate-betamethasone sodium phosphate 6 (3-3) MG/ML Outcome: tolerated well, no  immediate complications         Clinical History: CLINICAL DATA:  History of lower back pain   EXAM: MRI LUMBAR SPINE WITHOUT CONTRAST   TECHNIQUE: Multiplanar, multisequence MR imaging of the lumbar spine was performed. No intravenous contrast was administered.   COMPARISON:  MRI lumbar spine 01/21/2009   FINDINGS: Segmentation:  Standard   Alignment:  Physiologic   Vertebrae: Maintained vertebral body heights. There is multilevel disc desiccation. Multilevel facet arthritis, mild.   Conus medullaris and cauda equina: Conus extends to the L1-L2 level. Conus and cauda equina appear normal.   Paraspinal and other soft tissues: Aortic atherosclerotic disease, otherwise unremarkable.   Disc levels:   T12-L1: Minimal disc bulge. No significant spinal canal or neural foraminal narrowing.   L1-L2: No significant spinal canal or neural foraminal narrowing.   L2-L3: No significant spinal canal or neural foraminal narrowing.   L3-L4: Minimal disc bulge with mild bilateral neural foraminal narrowing.   L4-L5: Minimal disc bulge with mild bilateral neural foraminal narrowing.   L5-S1: Asymmetric left disc bulging and facet arthropathy results in moderate-severe left and mild-to-moderate right neural foraminal narrowing.   IMPRESSION: Asymmetric left disc bulging and facet arthropathy at L5-S1 resulting in moderate-severe left neuroforaminal narrowing and mild to moderate right neural foraminal narrowing. Additional mild degenerative changes above this level as described.     Electronically Signed   By: Caprice Renshaw   On: 07/22/2020 14:24  She reports that she quit smoking about 25 years ago. Her smoking use included cigarettes. She  started smoking about 55 years ago. She has a 30 pack-year smoking history. She has never used smokeless tobacco. No results for input(s): "HGBA1C", "LABURIC" in the last 8760 hours.  Objective:   Vital Signs: There were no vitals taken  for this visit.  Physical Exam  Gen: Well-appearing, in no acute distress; non-toxic CV: Regular Rate. Well-perfused. Warm.  Resp: Breathing unlabored on room air; no wheezing. Psych: Fluid speech in conversation; appropriate affect; normal thought process  Ortho Exam PHYSICAL EXAM:  General: Patient is well appearing and in no distress. Cervical spine mobility is full in all directions:  Skin and Muscle: No significant skin changes are apparent to upper extremities.   Range of Motion and Palpation Tests: Mobility is full about the elbows with flexion and extension. Forearm supination and pronation are 85/85 bilaterally.  Wrist flexion/extension is 75/65 bilaterally.  Digital flexion and extension are full.  Thumb opposition is full to the base of the small fingers bilaterally.    No cords or nodules are palpated.  No triggering is observed.    Left wrist: Moderate tenderness over the left thumb CMC articulation is observed.  CMC grind is positive for crepitus.  Scaphoid shift test is positive clunk minimal pain.  Finklestein test is positive.  Ulnar impingement test is negative.  No evidence of radiocarpal, midcarpal or intercarpal joint instability with provocation.  Neurologic, Vascular, Motor: Left Sensation is diminished to light touch in the left median nerve distribution, 2-point discrimination in the median nerve distribution is 7 mm   Tinel's testing positive left carpal tunnel Phalen's positive left, Derkan's compression positive left Thenar atrophy: Negative APB: 5/5  Fingers pink and well perfused.  Capillary refill is brisk.     Lab Results  Component Value Date   HGBA1C 6.0 02/25/2012     Imaging: No results found.  Past Medical/Family/Surgical/Social History: Medications & Allergies reviewed per EMR, new medications updated. 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    Other bursitis disorders    Spinal stenosis, unspecified region other than cervical    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    Vertigo    Family History  Problem Relation Age of Onset   Allergies Other        brother, sister, daughter   Heart disease Other        maternal grandmother and maternal grandfather   Prostate cancer Brother    Liver disease Brother    Past Surgical History:  Procedure Laterality Date   BACK SURGERY  1980's   BREAST BIOPSY Right    40 years ago- thinks right - benign   GALLBLADDER SURGERY  1990's   Social History   Occupational History   Not on file  Tobacco Use   Smoking status: Former    Current packs/day: 0.00    Average packs/day: 1 pack/day for 30.0 years (30.0 ttl pk-yrs)    Types: Cigarettes    Start date: 08/07/1967    Quit date: 08/06/1997    Years since  quitting: 25.5   Smokeless tobacco: Never  Substance and Sexual Activity   Alcohol use: No   Drug use: No   Sexual activity: Not on file    Erminie Foulks Fara Boros) Denese Killings, M.D.  [facility] 10:05 PM

## 2023-03-29 ENCOUNTER — Ambulatory Visit: Payer: Medicare HMO | Admitting: Orthopedic Surgery

## 2023-04-08 ENCOUNTER — Other Ambulatory Visit (HOSPITAL_COMMUNITY): Payer: Self-pay

## 2023-04-08 MED ORDER — ALBUTEROL SULFATE HFA 108 (90 BASE) MCG/ACT IN AERS
2.0000 | INHALATION_SPRAY | RESPIRATORY_TRACT | 6 refills | Status: AC | PRN
  Filled 2023-04-08: qty 6.7, 20d supply, fill #0

## 2023-04-08 MED ORDER — FLUTICASONE-SALMETEROL 250-50 MCG/ACT IN AEPB
1.0000 | INHALATION_SPRAY | Freq: Two times a day (BID) | RESPIRATORY_TRACT | 6 refills | Status: AC
  Filled 2023-04-08: qty 60, 30d supply, fill #0

## 2023-04-08 MED ORDER — AZITHROMYCIN 250 MG PO TABS
ORAL_TABLET | ORAL | 0 refills | Status: AC
  Filled 2023-04-08: qty 6, 5d supply, fill #0

## 2023-04-12 ENCOUNTER — Other Ambulatory Visit (HOSPITAL_COMMUNITY): Payer: Self-pay

## 2023-04-19 ENCOUNTER — Ambulatory Visit: Payer: HMO | Admitting: Orthopedic Surgery

## 2023-04-19 DIAGNOSIS — G5602 Carpal tunnel syndrome, left upper limb: Secondary | ICD-10-CM | POA: Diagnosis not present

## 2023-04-19 DIAGNOSIS — M654 Radial styloid tenosynovitis [de Quervain]: Secondary | ICD-10-CM | POA: Diagnosis not present

## 2023-04-19 NOTE — Progress Notes (Signed)
 YOUSRA Whitehead - 83 y.o. female MRN [id number]  Date of birth: [birth year]  Office Visit Note: Visit Date: 04/19/2023 PCP: Ferrell Hu, MD Referred by: Ferrell Hu, MD  Subjective: No chief complaint on file.  HPI: Charlotte Whitehead is a pleasant 83 y.o. female who presents today for follow up of ongoing left radial sided wrist pain with associated numbness and tingling of the radial sided digits.  She underwent injection to the left wrist first extensor compartment in the past which has helped mitigate her symptoms however they have since recurred.  At her most recent visit, in December of last year, she underwent injection to the carpal tunnel region for diagnostic and therapeutic benefit, she states that her numbness and tingling did improve for approximately 6 weeks, it has since returned.    Assessment & Plan: Visit Diagnoses:  No diagnosis found.   Plan: Extensive discussion was had with the patient today about her ongoing left sided carpal tunnel syndrome that is refractory to conservative care.  Patient has both clinical and electrodiagnostic evidence to confirm this diagnosis.  We have trialed bracing, activity modification and prior injection without lasting relief.  It is promising that the injection did help mitigate symptoms temporarily however in terms of a surgical indication.  She is also started to develop some mild thenar atrophy which is interval change from her most recent visit.  At this juncture, she is indicated for left open versus endoscopic carpal tunnel release.  Risks and benefits of both operations were discussed in detail today as well as forms of anesthesia.  Understanding all risks and benefits, patient would like to have surgery done in the form of left open carpal tunnel release under IV sedation.  In addition, given that her de Quervain's tenosynovitis of the left wrist continues to be persistent despite bracing and prior injection, we can perform first  extensor compartment release to be done in conjunction with the upcoming carpal tunnel release surgery.  We discussed ongoing conservative treatments for the de Quervain's tenosynovitis in the form of bracing, therapy and repeat injection, however given that her symptoms are quite significant in this region she is interested in having surgery done in conjunction with the carpal tunnel release.  Risks include but not limited to infection, bleeding, scarring, stiffness, nerve injury or vascular, tendon injury, risk of recurrence and need for subsequent operation were all discussed in detail.  Patient consented understanding the above.  Will move forward surgical scheduling of left open carpal tunnel release and first extensor compartment release under IV sedation at the next available date per patient preference.   Follow-up: No follow-ups on file.   Meds & Orders: No orders of the defined types were placed in this encounter.   No orders of the defined types were placed in this encounter.    Procedures: No procedures performed      Clinical History: CLINICAL DATA:  History of lower back pain   EXAM: MRI LUMBAR SPINE WITHOUT CONTRAST   TECHNIQUE: Multiplanar, multisequence MR imaging of the lumbar spine was performed. No intravenous contrast was administered.   COMPARISON:  MRI lumbar spine 01/21/2009   FINDINGS: Segmentation:  Standard   Alignment:  Physiologic   Vertebrae: Maintained vertebral body heights. There is multilevel disc desiccation. Multilevel facet arthritis, mild.   Conus medullaris and cauda equina: Conus extends to the L1-L2 level. Conus and cauda equina appear normal.   Paraspinal and other soft tissues: Aortic atherosclerotic disease, otherwise  unremarkable.   Disc levels:   T12-L1: Minimal disc bulge. No significant spinal canal or neural foraminal narrowing.   L1-L2: No significant spinal canal or neural foraminal narrowing.   L2-L3: No significant  spinal canal or neural foraminal narrowing.   L3-L4: Minimal disc bulge with mild bilateral neural foraminal narrowing.   L4-L5: Minimal disc bulge with mild bilateral neural foraminal narrowing.   L5-S1: Asymmetric left disc bulging and facet arthropathy results in moderate-severe left and mild-to-moderate right neural foraminal narrowing.   IMPRESSION: Asymmetric left disc bulging and facet arthropathy at L5-S1 resulting in moderate-severe left neuroforaminal narrowing and mild to moderate right neural foraminal narrowing. Additional mild degenerative changes above this level as described.     Electronically Signed   By: Achilles Holes   On: 07/22/2020 14:24  She reports that she quit smoking about 25 years ago. Her smoking use included cigarettes. She started smoking about 55 years ago. She has a 30 pack-year smoking history. She has never used smokeless tobacco. No results for input(s): "HGBA1C", "LABURIC" in the last 8760 hours.  Objective:   Vital Signs: There were no vitals taken for this visit.  Physical Exam  Gen: Well-appearing, in no acute distress; non-toxic CV: Regular Rate. Well-perfused. Warm.  Resp: Breathing unlabored on room air; no wheezing. Psych: Fluid speech in conversation; appropriate affect; normal thought process  Ortho Exam PHYSICAL EXAM:  General: Patient is well appearing and in no distress. Cervical spine mobility is full in all directions:  Skin and Muscle: No significant skin changes are apparent to upper extremities.   Range of Motion and Palpation Tests: Mobility is full about the elbows with flexion and extension. Forearm supination and pronation are 85/85 bilaterally.  Wrist flexion/extension is 75/65 bilaterally.  Digital flexion and extension are full.  Thumb opposition is full to the base of the small fingers bilaterally.    No cords or nodules are palpated.  No triggering is observed.    Left wrist: Moderate tenderness over the left  thumb CMC articulation is observed.  CMC grind is positive for crepitus.  Scaphoid shift test is positive clunk minimal pain.  Finklestein test is positive.  Ulnar impingement test is negative.  No evidence of radiocarpal, midcarpal or intercarpal joint instability with provocation.  Neurologic, Vascular, Motor: Left Sensation is diminished to light touch in the left median nerve distribution, 2-point discrimination in the median nerve distribution is 7 mm   Tinel's testing positive left carpal tunnel Phalen's positive left, Derkan's compression positive left Thenar atrophy: Mild APB: 4/5  Fingers pink and well perfused.  Capillary refill is brisk.     Lab Results  Component Value Date   HGBA1C 6.0 02/25/2012     Imaging: No results found.  Past Medical/Family/Surgical/Social History: Medications & Allergies reviewed per EMR, new medications updated. 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    Other bursitis disorders    Spinal stenosis, unspecified region other than cervical    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  Vertigo    Family History  Problem Relation Age of Onset   Allergies Other        brother, sister, daughter   Heart disease Other        maternal grandmother and maternal grandfather   Prostate cancer Brother    Liver disease Brother    Past Surgical History:  Procedure Laterality Date   BACK SURGERY  1980's   BREAST BIOPSY Right    40 years ago- thinks right - benign   GALLBLADDER SURGERY   1990's   Social History   Occupational History   Not on file  Tobacco Use   Smoking status: Former    Current packs/day: 0.00    Average packs/day: 1 pack/day for 30.0 years (30.0 ttl pk-yrs)    Types: Cigarettes    Start date: 08/07/1967    Quit date: 08/06/1997    Years since quitting: 25.7   Smokeless tobacco: Never  Substance and Sexual Activity   Alcohol use: No   Drug use: No   Sexual activity: Not on file    Mikaiah Stoffer Alvia Jointer, M.D. [facility] 10:21 AM

## 2023-05-10 ENCOUNTER — Other Ambulatory Visit (HOSPITAL_COMMUNITY): Payer: Self-pay

## 2023-05-10 MED ORDER — HYDROCODONE-ACETAMINOPHEN 5-325 MG PO TABS
1.0000 | ORAL_TABLET | Freq: Four times a day (QID) | ORAL | 0 refills | Status: DC | PRN
Start: 2023-05-07 — End: 2023-06-25
  Filled 2023-05-10: qty 120, 30d supply, fill #0

## 2023-05-14 ENCOUNTER — Encounter (HOSPITAL_BASED_OUTPATIENT_CLINIC_OR_DEPARTMENT_OTHER): Payer: Self-pay | Admitting: Orthopedic Surgery

## 2023-05-14 ENCOUNTER — Other Ambulatory Visit: Payer: Self-pay

## 2023-05-14 ENCOUNTER — Other Ambulatory Visit (HOSPITAL_COMMUNITY): Payer: Self-pay

## 2023-05-19 ENCOUNTER — Encounter (HOSPITAL_BASED_OUTPATIENT_CLINIC_OR_DEPARTMENT_OTHER)
Admission: RE | Admit: 2023-05-19 | Discharge: 2023-05-19 | Disposition: A | Discharge status: Home / Self Care | Source: Ambulatory Visit | Attending: Orthopedic Surgery | Admitting: Orthopedic Surgery

## 2023-05-19 ENCOUNTER — Other Ambulatory Visit: Payer: Self-pay

## 2023-05-19 DIAGNOSIS — Z01812 Encounter for preprocedural laboratory examination: Secondary | ICD-10-CM | POA: Insufficient documentation

## 2023-05-19 LAB — BASIC METABOLIC PANEL
Anion gap: 5 (ref 5–15)
BUN: 11 mg/dL (ref 8–23)
CO2: 31 mmol/L (ref 22–32)
Calcium: 9.5 mg/dL (ref 8.9–10.3)
Chloride: 103 mmol/L (ref 98–111)
Creatinine, Ser: 0.89 mg/dL (ref 0.44–1.00)
GFR, Estimated: >60 mL/min (ref >60)
Glucose, Bld: 100 mg/dL — ABNORMAL HIGH (ref 70–99)
Potassium: 3.9 mmol/L (ref 3.5–5.1)
Sodium: 139 mmol/L (ref 135–145)

## 2023-05-19 NOTE — Progress Notes (Signed)

## 2023-05-21 ENCOUNTER — Other Ambulatory Visit: Payer: Self-pay

## 2023-05-21 ENCOUNTER — Encounter (HOSPITAL_BASED_OUTPATIENT_CLINIC_OR_DEPARTMENT_OTHER)
Admission: RE | Disposition: A | Discharge status: Home / Self Care | Payer: Self-pay | Source: Ambulatory Visit | Attending: Orthopedic Surgery

## 2023-05-21 ENCOUNTER — Ambulatory Visit (HOSPITAL_BASED_OUTPATIENT_CLINIC_OR_DEPARTMENT_OTHER): Admitting: Anesthesiology

## 2023-05-21 ENCOUNTER — Encounter (HOSPITAL_BASED_OUTPATIENT_CLINIC_OR_DEPARTMENT_OTHER): Payer: Self-pay | Admitting: Orthopedic Surgery

## 2023-05-21 ENCOUNTER — Other Ambulatory Visit: Payer: Self-pay | Admitting: Orthopedic Surgery

## 2023-05-21 ENCOUNTER — Ambulatory Visit (HOSPITAL_BASED_OUTPATIENT_CLINIC_OR_DEPARTMENT_OTHER)
Admission: RE | Admit: 2023-05-21 | Discharge: 2023-05-21 | Disposition: A | Discharge status: Home / Self Care | Payer: HMO | Source: Ambulatory Visit | Attending: Orthopedic Surgery | Admitting: Orthopedic Surgery

## 2023-05-21 DIAGNOSIS — Z87891 Personal history of nicotine dependence: Secondary | ICD-10-CM | POA: Insufficient documentation

## 2023-05-21 DIAGNOSIS — K219 Gastro-esophageal reflux disease without esophagitis: Secondary | ICD-10-CM | POA: Insufficient documentation

## 2023-05-21 DIAGNOSIS — G4733 Obstructive sleep apnea (adult) (pediatric): Secondary | ICD-10-CM | POA: Insufficient documentation

## 2023-05-21 DIAGNOSIS — I119 Hypertensive heart disease without heart failure: Secondary | ICD-10-CM | POA: Diagnosis not present

## 2023-05-21 DIAGNOSIS — G5602 Carpal tunnel syndrome, left upper limb: Secondary | ICD-10-CM

## 2023-05-21 DIAGNOSIS — M654 Radial styloid tenosynovitis [de Quervain]: Secondary | ICD-10-CM | POA: Diagnosis not present

## 2023-05-21 DIAGNOSIS — I10 Essential (primary) hypertension: Secondary | ICD-10-CM

## 2023-05-21 DIAGNOSIS — Z79899 Other long term (current) drug therapy: Secondary | ICD-10-CM | POA: Diagnosis not present

## 2023-05-21 HISTORY — PX: CARPAL TUNNEL RELEASE: SHX101

## 2023-05-21 HISTORY — PX: REPAIR EXTENSOR TENDON: SHX5382

## 2023-05-21 HISTORY — DX: Gastro-esophageal reflux disease without esophagitis: K21.9

## 2023-05-21 HISTORY — DX: Sleep apnea, unspecified: G47.30

## 2023-05-21 HISTORY — DX: Anxiety disorder, unspecified: F41.9

## 2023-05-21 SURGERY — REPAIR, TENDON, EXTENSOR
Anesthesia: Monitor Anesthesia Care | Site: Wrist | Laterality: Left

## 2023-05-21 MED ORDER — LIDOCAINE HCL (CARDIAC) PF 100 MG/5ML IV SOSY
PREFILLED_SYRINGE | INTRAVENOUS | Status: DC | PRN
  Administered 2023-05-21: 20 mg via INTRAVENOUS

## 2023-05-21 MED ORDER — CEFAZOLIN SODIUM-DEXTROSE 2-4 GM/100ML-% IV SOLN
INTRAVENOUS | Status: AC
Start: 2023-05-21 — End: ?
  Filled 2023-05-21: qty 100

## 2023-05-21 MED ORDER — ATROPINE SULFATE 0.4 MG/ML IV SOLN
INTRAVENOUS | Status: AC
  Filled 2023-05-21: qty 1

## 2023-05-21 MED ORDER — PROPOFOL 500 MG/50ML IV EMUL
INTRAVENOUS | Status: DC | PRN
  Administered 2023-05-21: 50 ug/kg/min via INTRAVENOUS

## 2023-05-21 MED ORDER — EPHEDRINE SULFATE (PRESSORS) 50 MG/ML IJ SOLN
INTRAMUSCULAR | Status: DC | PRN
  Administered 2023-05-21 (×2): 10 mg via INTRAVENOUS

## 2023-05-21 MED ORDER — MIDAZOLAM HCL 2 MG/2ML IJ SOLN
INTRAMUSCULAR | Status: AC
  Filled 2023-05-21: qty 2

## 2023-05-21 MED ORDER — LIDOCAINE-EPINEPHRINE 1 %-1:100000 IJ SOLN
INTRAMUSCULAR | Status: DC | PRN
  Administered 2023-05-21: 20 mL

## 2023-05-21 MED ORDER — ACETAMINOPHEN 500 MG PO TABS
ORAL_TABLET | ORAL | Status: AC
  Filled 2023-05-21: qty 2

## 2023-05-21 MED ORDER — EPHEDRINE 5 MG/ML INJ
INTRAVENOUS | Status: AC
  Filled 2023-05-21: qty 5

## 2023-05-21 MED ORDER — LACTATED RINGERS IV SOLN: INTRAVENOUS | Status: DC

## 2023-05-21 MED ORDER — SUCCINYLCHOLINE CHLORIDE 200 MG/10ML IV SOSY
PREFILLED_SYRINGE | INTRAVENOUS | Status: AC
  Filled 2023-05-21: qty 10

## 2023-05-21 MED ORDER — FENTANYL CITRATE (PF) 100 MCG/2ML IJ SOLN
INTRAMUSCULAR | Status: AC
  Filled 2023-05-21: qty 2

## 2023-05-21 MED ORDER — SODIUM CHLORIDE 0.9 % IR SOLN
Status: DC | PRN
  Administered 2023-05-21: 400 mL

## 2023-05-21 MED ORDER — PROPOFOL 10 MG/ML IV BOLUS
INTRAVENOUS | Status: DC | PRN
  Administered 2023-05-21: 30 mg via INTRAVENOUS

## 2023-05-21 MED ORDER — FENTANYL CITRATE (PF) 100 MCG/2ML IJ SOLN
25.0000 ug | INTRAMUSCULAR | Status: DC | PRN
  Administered 2023-05-21 (×2): 50 ug via INTRAVENOUS

## 2023-05-21 MED ORDER — ONDANSETRON HCL 4 MG/2ML IJ SOLN
INTRAMUSCULAR | Status: AC
  Filled 2023-05-21: qty 2

## 2023-05-21 MED ORDER — ACETAMINOPHEN 500 MG PO TABS
1000.0000 mg | ORAL_TABLET | Freq: Once | ORAL | Status: AC
  Administered 2023-05-21: 500 mg via ORAL

## 2023-05-21 MED ORDER — ONDANSETRON HCL 4 MG/2ML IJ SOLN
INTRAMUSCULAR | Status: DC | PRN
  Administered 2023-05-21: 4 mg via INTRAVENOUS

## 2023-05-21 MED ORDER — FENTANYL CITRATE (PF) 100 MCG/2ML IJ SOLN
INTRAMUSCULAR | Status: DC | PRN
Start: 2023-05-21 — End: 2023-05-21
  Administered 2023-05-21: 25 ug via INTRAVENOUS

## 2023-05-21 MED ORDER — LIDOCAINE 2% (20 MG/ML) 5 ML SYRINGE
INTRAMUSCULAR | Status: AC
  Filled 2023-05-21: qty 5

## 2023-05-21 MED ORDER — CEFAZOLIN SODIUM-DEXTROSE 2-4 GM/100ML-% IV SOLN
2.0000 g | INTRAVENOUS | Status: AC
Start: 2023-05-21 — End: 2023-05-21
  Administered 2023-05-21: 2 g via INTRAVENOUS

## 2023-05-21 MED ORDER — PHENYLEPHRINE 80 MCG/ML (10ML) SYRINGE FOR IV PUSH (FOR BLOOD PRESSURE SUPPORT)
PREFILLED_SYRINGE | INTRAVENOUS | Status: AC
  Filled 2023-05-21: qty 10

## 2023-05-21 SURGICAL SUPPLY — 46 items
BLADE MINI RND TIP GREEN BEAV (BLADE) ×2 IMPLANT
BLADE SURG 15 STRL LF DISP TIS (BLADE) ×4 IMPLANT
BNDG COHESIVE 4X5 TAN STRL LF (GAUZE/BANDAGES/DRESSINGS) ×2 IMPLANT
BNDG ELASTIC 4INX 5YD STR LF (GAUZE/BANDAGES/DRESSINGS) ×4 IMPLANT
BNDG ESMARK 4X9 LF (GAUZE/BANDAGES/DRESSINGS) ×2 IMPLANT
BNDG GAUZE DERMACEA FLUFF 4 (GAUZE/BANDAGES/DRESSINGS) ×2 IMPLANT
CHLORAPREP W/TINT 26 (MISCELLANEOUS) ×2 IMPLANT
CORD BIPOLAR FORCEPS 12FT (ELECTRODE) ×2 IMPLANT
COVER BACK TABLE 60X90IN (DRAPES) ×2 IMPLANT
CUFF TOURN SGL QUICK 18X4 (TOURNIQUET CUFF) ×2 IMPLANT
DRAPE HAND 77X146 (DRAPES) ×2 IMPLANT
DRAPE SURG 17X23 STRL (DRAPES) ×2 IMPLANT
GAUZE PAD ABD 8X10 STRL (GAUZE/BANDAGES/DRESSINGS) ×2 IMPLANT
GAUZE SPONGE 4X4 12PLY STRL (GAUZE/BANDAGES/DRESSINGS) ×2 IMPLANT
GAUZE STRETCH 2X75IN STRL (MISCELLANEOUS) ×2 IMPLANT
GAUZE XEROFORM 1X8 LF (GAUZE/BANDAGES/DRESSINGS) ×2 IMPLANT
GLOVE BIO SURGEON STRL SZ7.5 (GLOVE) ×4 IMPLANT
GLOVE BIOGEL PI IND STRL 7.5 (GLOVE) ×2 IMPLANT
GOWN STRL REUS W/ TWL LRG LVL3 (GOWN DISPOSABLE) ×4 IMPLANT
GOWN STRL SURGICAL XL XLNG (GOWN DISPOSABLE) ×4 IMPLANT
MANIFOLD NEPTUNE II (INSTRUMENTS) ×2 IMPLANT
NDL HYPO 25X5/8 SAFETYGLIDE (NEEDLE) IMPLANT
NEEDLE HYPO 25X5/8 SAFETYGLIDE (NEEDLE) IMPLANT
NS IRRIG 1000ML POUR BTL (IV SOLUTION) ×2 IMPLANT
PACK BASIN DAY SURGERY FS (CUSTOM PROCEDURE TRAY) ×2 IMPLANT
PAD CAST 3X4 CTTN HI CHSV (CAST SUPPLIES) ×2 IMPLANT
SHEET MEDIUM DRAPE 40X70 STRL (DRAPES) ×2 IMPLANT
SPIKE FLUID TRANSFER (MISCELLANEOUS) IMPLANT
SPLINT FIBERGLASS 4X30 (CAST SUPPLIES) IMPLANT
SPLINT PLASTER CAST XFAST 4X15 (CAST SUPPLIES) ×2 IMPLANT
SPONGE SURGIFOAM ABS GEL 12-7 (HEMOSTASIS) IMPLANT
STOCKINETTE IMPERVIOUS 9X36 MD (GAUZE/BANDAGES/DRESSINGS) ×2 IMPLANT
SUCTION TUBE FRAZIER 10FR DISP (SUCTIONS) IMPLANT
SUT CHROMIC 3 0 SH 27 (SUTURE) IMPLANT
SUT ETHILON 4 0 PS 2 18 (SUTURE) ×2 IMPLANT
SUT MNCRL AB 3-0 PS2 27 (SUTURE) IMPLANT
SUT MNCRL AB 4-0 PS2 18 (SUTURE) IMPLANT
SUT PDS AB 4-0 RB1 27 (SUTURE) IMPLANT
SUT SILK 4 0 PS 2 (SUTURE) IMPLANT
SUT VIC AB 3-0 PS2 18XBRD (SUTURE) IMPLANT
SUT VIC AB 3-0 SH 27X BRD (SUTURE) IMPLANT
SYR BULB EAR ULCER 3OZ GRN STR (SYRINGE) ×4 IMPLANT
SYR CONTROL 10ML LL (SYRINGE) ×2 IMPLANT
TOWEL GREEN STERILE FF (TOWEL DISPOSABLE) ×4 IMPLANT
TUBE CONNECTING 20X1/4 (TUBING) IMPLANT
UNDERPAD 30X36 HEAVY ABSORB (UNDERPADS AND DIAPERS) ×2 IMPLANT

## 2023-05-21 NOTE — Anesthesia Preprocedure Evaluation (Addendum)
 Anesthesia Evaluation  Patient identified by MRN, date of birth, ID band Patient awake    Reviewed: Allergy & Precautions, H&P , NPO status , Patient's Chart, lab work & pertinent test results  Airway Mallampati: III  TM Distance: >3 FB Neck ROM: Full    Dental no notable dental hx. (+) Edentulous Upper, Dental Advisory Given   Pulmonary asthma , sleep apnea , former smoker   Pulmonary exam normal breath sounds clear to auscultation       Cardiovascular hypertension, Pt. on medications  Rhythm:Regular Rate:Normal     Neuro/Psych  Headaches  Anxiety        GI/Hepatic Neg liver ROS,GERD  Medicated,,  Endo/Other  negative endocrine ROS    Renal/GU negative Renal ROS  negative genitourinary   Musculoskeletal  (+) Arthritis , Osteoarthritis,    Abdominal   Peds  Hematology negative hematology ROS (+)   Anesthesia Other Findings   Reproductive/Obstetrics negative OB ROS                             Anesthesia Physical Anesthesia Plan  ASA: 3  Anesthesia Plan: MAC   Post-op Pain Management: Tylenol PO (pre-op)*   Induction: Intravenous  PONV Risk Score and Plan: 3 and Ondansetron, Propofol infusion and Dexamethasone  Airway Management Planned: Natural Airway and Simple Face Mask  Additional Equipment:   Intra-op Plan:   Post-operative Plan:   Informed Consent: I have reviewed the patients History and Physical, chart, labs and discussed the procedure including the risks, benefits and alternatives for the proposed anesthesia with the patient or authorized representative who has indicated his/her understanding and acceptance.     Dental advisory given  Plan Discussed with: CRNA  Anesthesia Plan Comments:        Anesthesia Quick Evaluation

## 2023-05-21 NOTE — Op Note (Signed)
 NAME: Charlotte Whitehead MEDICAL RECORD NO: [id number] DATE OF BIRTH: January 17, 1941 FACILITY: [facility] LOCATION: [facility] PHYSICIAN: Samuella Cota, MD   OPERATIVE REPORT   DATE OF PROCEDURE: 05/21/23    PREOPERATIVE DIAGNOSIS:  Left carpal tunnel syndrome 2.  Left wrist de Quervain's tenosynovitis   POSTOPERATIVE DIAGNOSIS:   Left carpal tunnel syndrome 2.  Left wrist de Quervain's tenosynovitis   PROCEDURE: Left open carpal tunnel release Left wrist first extensor compartment release   SURGEON:  Samuella Cota, M.D.   ASSISTANT: Lequita Halt Hooks, OPA   ANESTHESIA:  Local with sedation   INTRAVENOUS FLUIDS:  Per anesthesia flow sheet.   ESTIMATED BLOOD LOSS:  Minimal, less than 5 cc.   COMPLICATIONS:  None.   SPECIMENS:  none   TOURNIQUET TIME:    Total Tourniquet Time Documented: Upper Arm (Left) - 12 minutes Total: Upper Arm (Left) - 12 minutes    DISPOSITION:  Stable to PACU.   INDICATIONS: This is an 83 year old female who was seen in the outpatient setting and found to have clinical and electrodiagnostic evidence of left-sided carpal tunnel syndrome refractory to conservative care.  Patient was also found to have left wrist de Quervain's tenosynovitis that was refractory conservative care.  Patient was indicated for left open carpal tunnel release and first extensor compartment release.  Risks and benefits of surgery were discussed including the risks of infection, bleeding, scarring, stiffness, nerve injury, vascular injury, tendon injury, need for subsequent operation, , recurrence.  She voiced understanding of these risks and elected to proceed.  OPERATIVE COURSE: Patient was seen and identified in the preoperative area and marked appropriately.  Surgical consent had been signed. Preoperative IV antibiotic prophylaxis was given. She was transferred to the operating room and placed in supine position with the left upper extremity on an arm board.   Sedation was induced by the anesthesiologist. Left upper extremity was prepped and draped in normal sterile orthopedic fashion.  A surgical pause was performed between the surgeons, anesthesia, and operating room staff and all were in agreement as to the patient, procedure, and site of procedure.  Tourniquet was placed and padded appropriately to the left upper arm.  The arm was exsanguinated and the tourniquet was inflated to 250 mmHg.  We first began with the first extensor compartment release.  A 3 cm incision was designed distal to the radial styloid at the glabrous/non glabrous border of the dorsal radial wrist.  Crossing branches of the radial sensory nerve were identified and carefully protected.  The first extensor compartment was identified.  This was sharply incised along its dorsal most aspect.  The abductor pollicis longus was identified and released.  The subsheath to the extensor pollicis brevis was identified and sharply divided as well.   Following tendon sheath incision over the radial styloid, partial extensor tenosynovectomy was completed.  Inflammatory tissue was identified and sharply excised. Smooth gliding to the tendon services was noted.  There was no evidence of volar subluxation of the tendons with wrist circumduction.   We then turned our attention to the carpal tunnel.  Longitudinal incision was designed in the thenar crease in line with the radial border of the ring finger, from intersection of Kaplan's cardinal line down to level of the distal wrist crease. Incision was carried down utilizing 15 blade. Blunt dissection was performed, palmar fascia was identified and incised sharply utilizing a Beaver blade. Careful dissection was performed down, thenar musculature was bluntly elevated and the transcarpal ligament was  identified.  A Beaver blade was then utilized to divide the transcarpal ligament in a distal to proximal fashion.  Fat surrounding the palmar arch was encountered to  confirm appropriate distal release.  At the level of the wrist crease, skin flaps were elevated to allow for release of the proximal portion of transverse carpal ligament as well as the antebrachial fascia into the forearm. Appropriate decompression was noted of the median nerve, care was taken to protect the nerve in its entirety throughout.   Once we were satisfied with our proximal and distal release, tourniquet was deflated and bipolar electrocautery was utilized for hemostasis of both incision sites. Copious irrigation was performed followed by closure utilizing 4-0 nylon in standard fashion for the carpal tunnel incision. First extensor compartment incision wound was closed in layers utilizing 3-0 Monocryl for the subcutaneous tissue and 4-0 nylon for the skin surface.  Sterile dressings were applied followed by application of a thumb spica splint utilizing plaster.  Fingertips were pink with brisk capillary refill after deflation of tourniquet.  The operative drapes were broken down.  The patient was awoken from anesthesia safely and taken to PACU in stable condition.   Post-operative plan: The patient will recover in the post-anesthesia care unit and then be discharged home.  The patient will be non weight bearing on the left upper extremity in a thumb spica splint.   I will see the patient back in the office in 2 weeks for postoperative followup.    Samuella Cota, MD Electronically signed, 05/21/23

## 2023-05-21 NOTE — Anesthesia Postprocedure Evaluation (Signed)
 Anesthesia Post Note  Patient: Charlotte Whitehead  Procedure(s) Performed: LEFT WRIST FIRST EXTENSOR COMPARTMENT RELEASE (Left: Wrist) LEFT OPEN CARPAL TUNNEL RELEASE (Left: Wrist)     Patient location during evaluation: PACU Anesthesia Type: MAC Level of consciousness: awake and alert Pain management: pain level controlled Vital Signs Assessment: post-procedure vital signs reviewed and stable Respiratory status: spontaneous breathing, nonlabored ventilation and respiratory function stable Cardiovascular status: stable and blood pressure returned to baseline Postop Assessment: no apparent nausea or vomiting Anesthetic complications: no  No notable events documented.  Last Vitals:  Vitals:   05/21/23 1100 05/21/23 1117  BP: (!) 111/52 (!) 111/52  Pulse: 60 60  Resp: 16 15  Temp: (!) 36.2 C (!) 36.2 C  SpO2: 95% 95%    Last Pain:  Vitals:   05/21/23 1117  TempSrc: Temporal  PainSc: 3                  Devaughn Savant,W. EDMOND

## 2023-05-21 NOTE — H&P (Addendum)
 Charlotte Whitehead - 83 y.o. female MRN [id number]  Date of birth: 12-11-1940   Office Visit Note: Visit Date: 04/19/2023 PCP: Gwenyth Bender, MD Referred by: Gwenyth Bender, MD   Subjective: No chief complaint on file.   HPI: Charlotte Whitehead is a pleasant 83 y.o. female who presents today for left open carpal tunnel release and first extensor compartment release.       Assessment & Plan: Visit Diagnoses:  No diagnosis found.     Plan: Extensive discussion was had with the patient about her ongoing left sided carpal tunnel syndrome that is refractory to conservative care.  Patient has both clinical and electrodiagnostic evidence to confirm this diagnosis.  We have trialed bracing, activity modification and prior injection without lasting relief.  It is promising that the injection did help mitigate symptoms temporarily however in terms of a surgical indication.  She is also started to develop some mild thenar atrophy which is interval change from her most recent visit.  At this juncture, she is indicated for left open carpal tunnel release.   In addition, given that her de Quervain's tenosynovitis of the left wrist continues to be persistent despite bracing and prior injection, we can perform first extensor compartment release to be done in conjunction with the carpal tunnel release surgery.  We discussed ongoing conservative treatments for the de Quervain's tenosynovitis in the form of bracing, therapy and repeat injection, however given that her symptoms are quite significant in this region she is interested in having surgery done in conjunction with the carpal tunnel release.   Risks include but not limited to infection, bleeding, scarring, stiffness, nerve injury or vascular, tendon injury, risk of recurrence and need for subsequent operation were all discussed in detail.  Patient consented understanding the above.  Will move forward with left open carpal tunnel release and first extensor  compartment release under IV sedation.     Follow-up: No follow-ups on file.    Meds & Orders: No orders of the defined types were placed in this encounter.   No orders of the defined types were placed in this encounter.     Procedures: No procedures performed       Clinical History: CLINICAL DATA:  History of lower back pain   EXAM: MRI LUMBAR SPINE WITHOUT CONTRAST   TECHNIQUE: Multiplanar, multisequence MR imaging of the lumbar spine was performed. No intravenous contrast was administered.   COMPARISON:  MRI lumbar spine 01/21/2009   FINDINGS: Segmentation:  Standard   Alignment:  Physiologic   Vertebrae: Maintained vertebral body heights. There is multilevel disc desiccation. Multilevel facet arthritis, mild.   Conus medullaris and cauda equina: Conus extends to the L1-L2 level. Conus and cauda equina appear normal.   Paraspinal and other soft tissues: Aortic atherosclerotic disease, otherwise unremarkable.   Disc levels:   T12-L1: Minimal disc bulge. No significant spinal canal or neural foraminal narrowing.   L1-L2: No significant spinal canal or neural foraminal narrowing.   L2-L3: No significant spinal canal or neural foraminal narrowing.   L3-L4: Minimal disc bulge with mild bilateral neural foraminal narrowing.   L4-L5: Minimal disc bulge with mild bilateral neural foraminal narrowing.   L5-S1: Asymmetric left disc bulging and facet arthropathy results in moderate-severe left and mild-to-moderate right neural foraminal narrowing.   IMPRESSION: Asymmetric left disc bulging and facet arthropathy at L5-S1 resulting in moderate-severe left neuroforaminal narrowing and mild to moderate right neural foraminal narrowing. Additional mild degenerative changes above  this level as described.     Electronically Signed   By: Caprice Renshaw   On: 07/22/2020 14:24  She reports that she quit smoking about 25 years ago. Her smoking use included cigarettes.  She started smoking about 55 years ago. She has a 30 pack-year smoking history. She has never used smokeless tobacco.  Recent Labs (within last 365 days)  No results for input(s): "HGBA1C", "LABURIC" in the last 8760 hours.     Objective:   Vital Signs: There were no vitals taken for this visit.   Physical Exam  Gen: Well-appearing, in no acute distress; non-toxic CV: Regular Rate. Well-perfused. Warm.  Resp: Breathing unlabored on room air; no wheezing. Psych: Fluid speech in conversation; appropriate affect; normal thought process   Ortho Exam PHYSICAL EXAM:   General: Patient is well appearing and in no distress. Cervical spine mobility is full in all directions:   Skin and Muscle: No significant skin changes are apparent to upper extremities.    Range of Motion and Palpation Tests: Mobility is full about the elbows with flexion and extension. Forearm supination and pronation are 85/85 bilaterally.  Wrist flexion/extension is 75/65 bilaterally.  Digital flexion and extension are full.  Thumb opposition is full to the base of the small fingers bilaterally.     No cords or nodules are palpated.  No triggering is observed.     Left wrist: Moderate tenderness over the left thumb CMC articulation is observed.  CMC grind is positive for crepitus.  Scaphoid shift test is positive clunk minimal pain.  Finklestein test is positive.  Ulnar impingement test is negative.  No evidence of radiocarpal, midcarpal or intercarpal joint instability with provocation.   Neurologic, Vascular, Motor: Left Sensation is diminished to light touch in the left median nerve distribution, 2-point discrimination in the median nerve distribution is 7 mm   Tinel's testing positive left carpal tunnel Phalen's positive left, Derkan's compression positive left Thenar atrophy: Mild APB: 4/5   Fingers pink and well perfused.  Capillary refill is brisk.       Recent Labs       Lab Results  Component Value  Date    HGBA1C 6.0 02/25/2012          Imaging: No results found.   Past Medical/Family/Surgical/Social History: Medications & Allergies reviewed per EMR, new medications updated.     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     Other bursitis disorders     Spinal stenosis, unspecified region other than cervical     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     Vertigo               Family History  Problem Relation Age of Onset   Allergies Other          brother, sister, daughter   Heart disease Other          maternal grandmother and  maternal grandfather   Prostate cancer Brother     Liver disease Brother               Past Surgical History:  Procedure Laterality Date   BACK SURGERY   1980's   BREAST BIOPSY Right      40 years ago- thinks right - benign   GALLBLADDER SURGERY   1990's        Social History         Occupational History   Not on file  Tobacco Use   Smoking status: Former      Current packs/day: 0.00      Average packs/day: 1 pack/day for 30.0 years (30.0 ttl pk-yrs)      Types: Cigarettes      Start date: 08/07/1967      Quit date: 08/06/1997      Years since quitting: 25.7   Smokeless tobacco: Never  Substance and Sexual Activity   Alcohol use: No   Drug use: No   Sexual activity: Not on file      Kehinde Bowdish Trevor Mace, M.D. [facility]

## 2023-05-21 NOTE — Transfer of Care (Signed)
 Immediate Anesthesia Transfer of Care Note  Patient: Charlotte Whitehead  Procedure(s) Performed: LEFT WRIST FIRST EXTENSOR COMPARTMENT RELEASE (Left: Wrist) LEFT OPEN CARPAL TUNNEL RELEASE (Left: Wrist)  Patient Location: PACU  Anesthesia Type:MAC  Level of Consciousness: awake, alert , oriented, drowsy, and patient cooperative  Airway & Oxygen Therapy: Patient Spontanous Breathing and Patient connected to face mask oxygen  Post-op Assessment: Report given to RN and Post -op Vital signs reviewed and stable  Post vital signs: Reviewed and stable  Last Vitals:  Vitals Value Taken Time  BP    Temp    Pulse 56 05/21/23 1001  Resp    SpO2 99 % 05/21/23 1001  Vitals shown include unfiled device data.  Last Pain:  Vitals:   05/21/23 0727  TempSrc: Temporal  PainSc: 0-No pain         Complications: No notable events documented.

## 2023-05-21 NOTE — Discharge Instructions (Addendum)
    Hand Surgery Postop Instructions  Next tylenol dose after 1:45pm   Dressings: Maintain postoperative dressing until orthopedic follow-up.  Keep operative site clean and dry until orthopedic follow-up.  Wound Care: Keep your hand elevated above the level of your heart.  Do not allow it to dangle by your side. Moving your fingers is advised to stimulate circulation but will depend on the site of your surgery.  If you have a splint applied, your doctor will advise you regarding movement.  Activity: Do not drive or operate machinery until clearance given from physician. No heavy lifting with operative extremity.  Diet:  Drink liquids today or eat a light diet.  You may resume a regular diet tomorrow.    General expectations: Take prescribed medication if given, transition to over-the-counter medication as quickly as possible. Fingers may become slightly swollen.  Call your doctor if any of the following occur: Severe pain not relieved by pain medication. Elevated temperature. Dressing soaked with blood. Inability to move fingers. White or bluish color to fingers.   Per [facility] clinic policy, our goal is ensure optimal postoperative pain control with a multimodal pain management strategy. For all [facility] patients, our goal is to wean post-operative narcotic medications by 6 weeks post-operatively. If this is not possible due to utilization of pain medication prior to surgery, your [facility] doctor will support your acute post-operative pain control for the first 6 weeks postoperatively, with a plan to transition you back to your primary pain team following that. Cyndia Skeeters will work to ensure a Therapist, occupational.  Anshul Trevor Mace, M.D. Hand Surgery [facility]    Post Anesthesia Home Care Instructions  Activity: Get plenty of rest for the remainder of the day. A responsible individual must stay with you for 24 hours following the procedure.  For the next 24  hours, DO NOT: -Drive a car -Advertising copywriter -Drink alcoholic beverages -Take any medication unless instructed by your physician -Make any legal decisions or sign important papers.  Meals: Start with liquid foods such as gelatin or soup. Progress to regular foods as tolerated. Avoid greasy, spicy, heavy foods. If nausea and/or vomiting occur, drink only clear liquids until the nausea and/or vomiting subsides. Call your physician if vomiting continues.  Special Instructions/Symptoms: Your throat may feel dry or sore from the anesthesia or the breathing tube placed in your throat during surgery. If this causes discomfort, gargle with warm salt water. The discomfort should disappear within 24 hours.  If you had a scopolamine patch placed behind your ear for the management of post- operative nausea and/or vomiting:  1. The medication in the patch is effective for 72 hours, after which it should be removed.  Wrap patch in a tissue and discard in the trash. Wash hands thoroughly with soap and water. 2. You may remove the patch earlier than 72 hours if you experience unpleasant side effects which may include dry mouth, dizziness or visual disturbances. 3. Avoid touching the patch. Wash your hands with soap and water after contact with the patch.

## 2023-05-22 ENCOUNTER — Encounter (HOSPITAL_BASED_OUTPATIENT_CLINIC_OR_DEPARTMENT_OTHER): Payer: Self-pay | Admitting: Orthopedic Surgery

## 2023-06-03 ENCOUNTER — Encounter: Payer: HMO | Admitting: Orthopedic Surgery

## 2023-06-03 ENCOUNTER — Encounter: Admitting: Rehabilitative and Restorative Service Providers"

## 2023-06-07 ENCOUNTER — Other Ambulatory Visit (HOSPITAL_COMMUNITY): Payer: Self-pay

## 2023-06-07 ENCOUNTER — Ambulatory Visit (INDEPENDENT_AMBULATORY_CARE_PROVIDER_SITE_OTHER): Admitting: Orthopedic Surgery

## 2023-06-07 DIAGNOSIS — G5602 Carpal tunnel syndrome, left upper limb: Secondary | ICD-10-CM

## 2023-06-07 DIAGNOSIS — M654 Radial styloid tenosynovitis [de Quervain]: Secondary | ICD-10-CM

## 2023-06-07 NOTE — Progress Notes (Signed)
   Charlotte Whitehead - 83 y.o. female MRN [id number]  Date of birth: 04-14-1940  Office Visit Note: Visit Date: 06/07/2023 PCP: Gwenyth Bender, MD Referred by: Gwenyth Bender, MD  Subjective:  HPI: Charlotte Whitehead is a 83 y.o. female who presents today for follow up 2 weeks status post left wrist first extensor compartment release, left open carpal tunnel release.  She is doing well overall, numbness and tingling is improving throughout the hand, pain is well-controlled at the wrist region as well.  Pertinent ROS were reviewed with the patient and found to be negative unless otherwise specified above in HPI.   Assessment & Plan: Visit Diagnoses:  1. De Quervain's disease (radial styloid tenosynovitis)   2. Carpal tunnel syndrome, left upper limb     Plan: She is doing well postoperatively.  Wounds demonstrate appropriate healing on today's visit.  Sutures removed today and Steri-Strips applied.  She will be seen by occupational therapy later this week for fabrication of an orthosis for the left thumb and institute range of motion exercises with progression of strengthening in 2 weeks.  Temporary removable thumb spica brace was provided today for comfort.  Follow-up: No follow-ups on file.   Meds & Orders: No orders of the defined types were placed in this encounter.  No orders of the defined types were placed in this encounter.    Procedures: No procedures performed       Objective:   Vital Signs: There were no vitals taken for this visit.  Ortho Exam Left wrist: - Well-healing palmar incision as well as radial incision, sutures in place, skin edges well-approximated without erythema or drainage - Digital range of motion is well-preserved, near composite fist without significant restriction - Sensation intact light touch in the median nerve distribution - Gentle thumb circumduction without significant pain, no evidence of tendon subluxation - APB 4/5, thumb opposition  preserved  Imaging: No results found.   Khylon Davies Trevor Mace, M.D. [facility], Hand Surgery

## 2023-06-09 NOTE — Therapy (Signed)
 OUTPATIENT OCCUPATIONAL THERAPY ORTHO EVALUATION  Patient Name: Charlotte Whitehead MRN: [id number] DOB:[birth year], 83 y.o., female Today's Date: 06/10/2023  PCP: Diamantina Providence MD REFERRING PROVIDER: Samuella Cota, MD   END OF SESSION:  OT End of Session - 06/10/23 1351     Visit Number 1    Number of Visits 10    Date for OT Re-Evaluation 07/23/23    Authorization Type [employer]    OT Start Time 1351    OT Stop Time 1430    OT Time Calculation (min) 39 min    Equipment Utilized During Treatment Tubigrip    Activity Tolerance Patient tolerated treatment well;No increased pain;Patient limited by fatigue;Patient limited by pain;Patient limited by lethargy    Behavior During Therapy [facility] for tasks assessed/performed             Past Medical History:  Diagnosis Date   Abdominal pain, unspecified site    Allergic rhinitis due to pollen    Anxiety    DJD (degenerative joint disease)    Enthesopathy of unspecified site    Essential hypertension, malignant    Extrinsic asthma, unspecified    Fecal incontinence    GERD (gastroesophageal reflux disease)    Headache(784.0)    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    Other bursitis disorders    Sleep apnea    no CPAP   Spinal stenosis, unspecified region other than cervical    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    Vertigo    Past Surgical History:  Procedure Laterality Date   BACK SURGERY  1980's   BREAST BIOPSY Right    40 years ago- thinks right - benign   CARDIAC CATHETERIZATION  2012   Dr Elliot Gurney intervention   CARPAL TUNNEL RELEASE Left 05/21/2023   Procedure: LEFT OPEN CARPAL TUNNEL RELEASE;  Surgeon: Samuella Cota, MD;  Location: [facility];  Service: Orthopedics;  Laterality: Left;   CHOLECYSTECTOMY     GALLBLADDER SURGERY  1990's   REPAIR EXTENSOR TENDON Left 05/21/2023   Procedure: LEFT WRIST FIRST EXTENSOR COMPARTMENT RELEASE;  Surgeon: Samuella Cota, MD;  Location: [facility];  Service: Orthopedics;  Laterality: Left;   Patient Active Problem List   Diagnosis Date Noted   Carpal tunnel syndrome, left upper limb 05/21/2023   De Quervain's disease (radial styloid tenosynovitis) 05/21/2023   Obstructive sleep apnea 08/13/2011   Nonallergic rhinitis 08/13/2011   Mild intermittent asthma 08/13/2011    ONSET DATE: DOS 05/21/23  REFERRING DIAG:  M65.4 (ICD-10-CM) - Tommi Rumps Quervain's disease (tenosynovitis)  G56.02 (ICD-10-CM) - Carpal tunnel syndrome, left upper limb    THERAPY DIAG:  Muscle weakness (generalized)  Localized edema  Pain in left wrist  Stiffness of left wrist, not elsewhere classified  Other lack of coordination  Rationale for Evaluation and Treatment: Rehabilitation  SUBJECTIVE:   SUBJECTIVE STATEMENT: Now  ~3  weeks s/p Lt CTR and deQuervain's releases.  She states having pain ~1 year or so in the wrist and her fingers had been numb for several months before her surgery. She tried injection .    PERTINENT HISTORY: Past history of DJD, hypertension, cardiac catheterization and some other issues but no diabetes  PRECAUTIONS: None  RED FLAGS: None   WEIGHT BEARING RESTRICTIONS: Yes recommended no heavy pushing pulling or gripping for the next 4 to 6 weeks (under 5 to 10 pounds is okay if not painful)  PAIN:  Are you having pain? Yes: NPRS scale: 6/10 at rest now  and up to 7-8/10 at worst in past week Pain location: Left surgical sites at carpal tunnel and first extensor compartment Pain description: Aching and hypersensitive Aggravating factors: Gripping and squeezing Relieving factors: Rest  FALLS: Has patient fallen in last 6 months? No  PLOF: Independent  PATIENT GOALS: To improve use of left dominant hand and arm and decrease pain  NEXT MD VISIT: 07/12/2023    OBJECTIVE: (All objective assessments below are from initial evaluation on: 06/10/23 unless otherwise specified.)   HAND DOMINANCE:  LEFT  ADLs: Overall ADLs: States decreased ability to grab, hold household objects, pain and difficulty to open containers, perform FMS tasks (manipulate fasteners on clothing), mild to moderate bathing problems as well.    FUNCTIONAL OUTCOME MEASURES: Eval: Quick DASH 79.5% impairment today  (Higher % Score  =  More Impairment)     UPPER EXTREMITY ROM     Shoulder to Wrist AROM LEFT eval  Forearm supination 88  Forearm pronation  70  Wrist flexion 21  Wrist extension 26  Wrist ulnar deviation 19  Wrist radial deviation 15  (Blank rows = not tested)   Hand AROM LEFT eval  Full Fist Ability (or Gap to Distal Palmar Crease) Full fist but stiff  Thumb Opposition  (Kapandji Scale)  6/10  (Blank rows = not tested)   UPPER EXTREMITY MMT:    Eval:  NT at eval due to recent and still healing injuries, but grossly 3 -/5 at left hand and wrist now. Will be tested when appropriate.   MMT Right TBD Left TBD  Shoulder flexion    Shoulder abduction    Shoulder adduction    Shoulder extension    Shoulder internal rotation    Shoulder external rotation    Middle trapezius    Lower trapezius    Elbow flexion    Elbow extension    Forearm supination    Forearm pronation    Wrist flexion    Wrist extension    Wrist ulnar deviation    Wrist radial deviation    (Blank rows = not tested)  HAND FUNCTION: Eval: Observed weakness in affected left hand as seen by stiffness and poor motion, details will be tested in future sessions when safe.  Grip strength Right: TBD lbs, Left: TBD lbs   COORDINATION: Eval: Observed coordination impairments with affected left hand.  Details will be tested in future sessions as time allows 9 Hole Peg Test Left: TBD sec (TBD sec is WFL)   SENSATION: Eval:  Light touch diminished around sx area and in the tips of digits 1 through 3 of the left hand.  She states this is not significantly better since surgery yet.  She describes some hypersensitivity  around her surgical sites  EDEMA:   Eval:  Mildly swollen in left hand and wrist today  COGNITION: Eval: Overall cognitive status: WFL for evaluation today   OBSERVATIONS:   Eval: Surgical sites are healing up well, no signs of infection, have some hypersensitivity to touch, no dehiscence, overt stiffness and some anxiousness is present when trying to move her hand and arm.   TODAY'S TREATMENT:  Post-evaluation treatment:   For safety/self-care we discussed wound care management, and OT helps her wash her hand and discusses not soaking wound, keeping it clean, using Vaseline to help it heal smoothly and present a barrier.  She should perform gentle scar mobilizations to help with nerve sensitivity and myofascial release, but should avoid strong gripping pushing and pulling and  anything that could cause the wound to dehisce.   She was also given the following home exercise program to perform approximately 4-6 times a day as tolerated to prevent stiffness.  She can wear her prefabricated thumb spica brace during the night and at other times to protect her hand in her arm, but she can also take this off as tolerated and wean from it for light functional activities and at night if she tolerates.    She states understanding all directions and leaves in no increase in pain   Exercises - Turn Palm Facing Up & Down  - 4-6 x daily - 10-15 reps - Bend and Pull Back Wrist SLOWLY  - 4 x daily - 10-15 reps - "Windshield Wipers"   - 4 x daily - 10-15 reps - Tendon Glides  - 4-6 x daily - 3-5 reps - 2-3 seconds hold - Thumb Opposition  - 4-6 x daily - 10 reps Patient Education - Scar Massage   PATIENT EDUCATION: Education details: See tx section above for details  Person educated: Patient Education method: Verbal Instruction, Teach back, Handouts  Education comprehension: States and demonstrates understanding, Additional Education required    HOME EXERCISE PROGRAM: Access Code:  [email or web address] URL: [email or web address] Date: 06/10/2023 Prepared by: Fannie Knee     GOALS: Goals reviewed with patient? Yes   SHORT TERM GOALS: (STG required if POC>30 days) Target Date: 06/25/2023  Pt will obtain protective, custom orthotic. Goal status: TBD/PRN  2.  Pt will demo/state understanding of initial HEP to improve pain levels and prerequisite motion. Goal status: INITIAL   LONG TERM GOALS: Target Date: 07/23/2023  Pt will improve functional ability by decreased impairment per Quick DASH assessment from 79.5% to 30% or better, for better quality of life. Goal status: INITIAL  2.  Pt will improve grip strength in left hand to at least 25 lbs for functional use at home and in IADLs. Goal status: INITIAL  3.  Pt will improve A/ROM in left wrist flexion/extension from 21/26 degrees respectively to at least 45 degrees each, to have functional motion for tasks like reach and grasp.  Goal status: INITIAL  4.  Pt will improve strength in left wrist flexion/extension from apparent 3 -/5 MMT to at least 4/5 MMT to have increased functional ability to carry out selfcare and higher-level homecare tasks with less difficulty. Goal status: INITIAL  5.  Pt will improve coordination skills in left dominant hand, as seen by within functional limit score on nine-hole peg testing to have increased functional ability to carry out fine motor tasks (fasteners, etc.) and more complex, coordinated IADLs (meal prep, sports, etc.).  Goal status: INITIAL  6.  Pt will decrease pain at worst from 7-8/10 to 3/10 or better to have better sleep and occupational participation in daily roles. Goal status: INITIAL   ASSESSMENT:  CLINICAL IMPRESSION: Patient is a 83 y.o. female who was seen today for occupational therapy evaluation for stiffness, weakness, pain, hypersensitivity and also paresthesia following left carpal tunnel release and de Quervain's releases.  She will benefit  from outpatient occupational therapy to decrease symptoms and increase quality of life.   PERFORMANCE DEFICITS: in functional skills including ADLs, IADLs, coordination, dexterity, proprioception, sensation, edema, ROM, strength, pain, fascial restrictions, muscle spasms, flexibility, Fine motor control, Gross motor control, body mechanics, endurance, decreased knowledge of precautions, wound, and UE functional use, cognitive skills including problem solving and safety awareness, and psychosocial skills including coping strategies, environmental adaptation, and habits.  IMPAIRMENTS: are limiting patient from ADLs, IADLs, rest and sleep, and leisure.   COMORBIDITIES: may have co-morbidities  that affects occupational performance. Patient will benefit from skilled OT to address above impairments and improve overall function.  MODIFICATION OR ASSISTANCE TO COMPLETE EVALUATION: Min-Moderate modification of tasks or assist with assess necessary to complete an evaluation.  OT OCCUPATIONAL PROFILE AND HISTORY: Detailed assessment: Review of records and additional review of physical, cognitive, psychosocial history related to current functional performance.  CLINICAL DECISION MAKING: Moderate - several treatment options, min-mod task modification necessary  REHAB POTENTIAL: Excellent  EVALUATION COMPLEXITY: Moderate      PLAN:  OT FREQUENCY: 1-2x/week  OT DURATION: 6 weeks through 07/23/2023 and up to 10 total visits as needed  PLANNED INTERVENTIONS: 97168 OT Re-evaluation, 97535 self care/ADL training, 96045 therapeutic exercise, 97530 therapeutic activity, 97112 neuromuscular re-education, 97140 manual therapy, 97035 ultrasound, 97039 fluidotherapy, 97010 moist heat, 97010 cryotherapy, 97760 Orthotics management and training, 40981 Subsequent splinting/medication, scar mobilization, passive range of motion, compression bandaging, Dry needling, coping strategies training, patient/family  education, and DME and/or AE instructions  RECOMMENDED OTHER SERVICES: None now  CONSULTED AND AGREED WITH PLAN OF CARE: Patient  PLAN FOR NEXT SESSION:   Review initial recommendations and home exercise program.  Continue weaning from prefabricated brace, unless she is very painful.  If she is having any problems with this brace fit or function, a custom fabricated orthosis may be crafted.  Upgrade to the light stretches as tolerated   Fannie Knee, OTR/L, CHT 06/10/2023, 5:28 PM

## 2023-06-10 ENCOUNTER — Ambulatory Visit: Admitting: Rehabilitative and Restorative Service Providers"

## 2023-06-10 ENCOUNTER — Encounter: Payer: Self-pay | Admitting: Rehabilitative and Restorative Service Providers"

## 2023-06-10 DIAGNOSIS — M25532 Pain in left wrist: Secondary | ICD-10-CM | POA: Diagnosis not present

## 2023-06-10 DIAGNOSIS — R278 Other lack of coordination: Secondary | ICD-10-CM

## 2023-06-10 DIAGNOSIS — R600 Localized edema: Secondary | ICD-10-CM

## 2023-06-10 DIAGNOSIS — M6281 Muscle weakness (generalized): Secondary | ICD-10-CM

## 2023-06-10 DIAGNOSIS — M25632 Stiffness of left wrist, not elsewhere classified: Secondary | ICD-10-CM

## 2023-06-10 NOTE — Patient Instructions (Signed)
  Nate's Carpal Tunnel Release Recommendations   Do not to soak your wound for at least 1 week, or until it looks well healed. Sponge-bathe your hand or shower quickly, then dab dry gently. You can put a Vaseline or plain lotion on your closed wound to help the scar heal smoothly. (Don't leave it looking dry and crusty.)   Try not to lean on your palm, sleep on your hand or position your wrist in a bent position for a long time. These things put pressure on your healing nerve. Your sensation (or tingling/numbness) should slowly improve as your nerve heals, but it can be normal to take 3-6 months to fully heal.   Avoid any strong gripping, push, pull, weight bearing or repetitive motion with your surgical arm for the next 6 weeks.  (If it hurts very badly- don't do it!) After 6 weeks, you can progressively return to all normal, light activities. Sports and heavy weightlifting should be withheld for about 3 months.   Once your surgical wound is looking healed and closed (about 3 days after stitches are out) start touching/rubbing your scar and gently trying to shift the top layer of your skin. It's ok to do this gently over top of steri-strips. Do not remove steri-strips, but wait for them to fall off naturally. Rub, touch gently for 2-3 minutes, 3-4 x day.      It's normal to have some "buzzing" or "burning" pain after a nerve surgery. If you have any tingling or itching/burning around your scar, just lightly rub/brush it with a cloth for 2-3 mins to calm your nerves. Do it progressively and rub and touch more aggressively as the weeks go on. Keep it pretty light for the first 3-4 weeks.   Starting 1-2 weeks after surgery, do the following exercises 4 times a day, non-painfully, feeling light to medium tension.  (See exercise sheet) If you're sore- slow down!   If you're stiff- speed up!   Wait 5-6 weeks after surgery to use "squeeze balls" or "hand grippers"  Wait 7-8 weeks after surgery to start  strengthening in your arm in a gym setting  In general, keep your hand and wrist moving, and don't let it heal stiffly.   If you are having any new problems, lingering stiffness, pain, signs of infection (redness, swelling, oozing puss, tenderness), contact your surgeon immediately.  He may send you to formal hand therapy.

## 2023-06-14 NOTE — Therapy (Signed)
 OUTPATIENT OCCUPATIONAL THERAPY TREATMENT NOTE   Patient Name: Charlotte Whitehead MRN: [id number] DOB:[birth year], 83 y.o., female Today's Date: 06/15/2023  PCP: Diamantina Providence MD REFERRING PROVIDER: Samuella Cota, MD   END OF SESSION:  OT End of Session - 06/15/23 1550     Visit Number 2    Number of Visits 10    Date for OT Re-Evaluation 07/23/23    Authorization Type [employer]    OT Start Time 1554    OT Stop Time 1632    OT Time Calculation (min) 38 min    Equipment Utilized During Treatment --    Activity Tolerance Patient tolerated treatment well;No increased pain;Patient limited by fatigue;Patient limited by pain;Patient limited by lethargy    Behavior During Therapy [facility] for tasks assessed/performed              Past Medical History:  Diagnosis Date   Abdominal pain, unspecified site    Allergic rhinitis due to pollen    Anxiety    DJD (degenerative joint disease)    Enthesopathy of unspecified site    Essential hypertension, malignant    Extrinsic asthma, unspecified    Fecal incontinence    GERD (gastroesophageal reflux disease)    Headache(784.0)    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    Other bursitis disorders    Sleep apnea    no CPAP   Spinal stenosis, unspecified region other than cervical    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    Vertigo    Past Surgical History:  Procedure Laterality Date   BACK SURGERY  1980's   BREAST BIOPSY Right    40 years ago- thinks right - benign   CARDIAC CATHETERIZATION  2012   Dr Elliot Gurney intervention   CARPAL TUNNEL RELEASE Left 05/21/2023   Procedure: LEFT OPEN CARPAL TUNNEL RELEASE;  Surgeon: Samuella Cota, MD;  Location: [facility];  Service: Orthopedics;  Laterality: Left;   CHOLECYSTECTOMY     GALLBLADDER SURGERY  1990's   REPAIR EXTENSOR TENDON Left 05/21/2023   Procedure: LEFT WRIST FIRST EXTENSOR COMPARTMENT RELEASE;  Surgeon: Samuella Cota, MD;  Location: [facility];  Service: Orthopedics;  Laterality: Left;   Patient Active Problem List   Diagnosis Date Noted   Carpal tunnel syndrome, left upper limb 05/21/2023   Tommi Rumps Quervain's disease (radial styloid tenosynovitis) 05/21/2023   Obstructive sleep apnea 08/13/2011   Nonallergic rhinitis 08/13/2011   Mild intermittent asthma 08/13/2011    ONSET DATE: DOS 05/21/23  REFERRING DIAG:  M65.4 (ICD-10-CM) - Tommi Rumps Quervain's disease (tenosynovitis)  G56.02 (ICD-10-CM) - Carpal tunnel syndrome, left upper limb    THERAPY DIAG:  Muscle weakness (generalized)  Localized edema  Pain in left wrist  Stiffness of left wrist, not elsewhere classified  Other lack of coordination  Rationale for Evaluation and Treatment: Rehabilitation  PERTINENT HISTORY: Past history of DJD, hypertension, cardiac catheterization and some other issues but no diabetes She states having pain ~1 year or so in the wrist and her fingers had been numb for several months before her surgery. She tried injection .   PRECAUTIONS: None  RED FLAGS: None   WEIGHT BEARING RESTRICTIONS: Yes recommended no heavy pushing pulling or gripping for the next 4 to 6 weeks (under 5 to 10 pounds is okay if not painful)    SUBJECTIVE:   SUBJECTIVE STATEMENT: Now  3+  weeks s/p Lt CTR and deQuervain's releases.  She states still having some sharp and burning nerve pains around  the surgical sites especially at the base of the thumb.  Carpal tunnel release area is doing well.Marland Kitchen     PAIN:  Are you having pain? Yes: NPRS scale: 5/10 at rest now and up to 7-8/10 at worst in past week Pain location: Left surgical sites at carpal tunnel and first extensor compartment Pain description: Aching and hypersensitive Aggravating factors: Gripping and squeezing Relieving factors: Rest    PATIENT GOALS: To improve use of left dominant hand and arm and decrease pain  NEXT MD VISIT: 07/12/2023    OBJECTIVE: (All objective assessments below are  from initial evaluation on: 06/10/23 unless otherwise specified.)   HAND DOMINANCE: LEFT  ADLs: Overall ADLs: States decreased ability to grab, hold household objects, pain and difficulty to open containers, perform FMS tasks (manipulate fasteners on clothing), mild to moderate bathing problems as well.    FUNCTIONAL OUTCOME MEASURES: Eval: Quick DASH 79.5% impairment today  (Higher % Score  =  More Impairment)     UPPER EXTREMITY ROM     Shoulder to Wrist AROM LEFT eval Lt 06/15/23  Forearm supination 88   Forearm pronation  70   Wrist flexion 21 15  Wrist extension 26 46  Wrist ulnar deviation 19   Wrist radial deviation 15   (Blank rows = not tested)   Hand AROM LEFT eval  Full Fist Ability (or Gap to Distal Palmar Crease) Full fist but stiff  Thumb Opposition  (Kapandji Scale)  6/10  (Blank rows = not tested)   UPPER EXTREMITY MMT:    Eval:  NT at eval due to recent and still healing injuries, but grossly 3 -/5 at left hand and wrist now. Will be tested when appropriate.   MMT Right TBD Left TBD  Shoulder flexion    Shoulder abduction    Shoulder adduction    Shoulder extension    Shoulder internal rotation    Shoulder external rotation    Middle trapezius    Lower trapezius    Elbow flexion    Elbow extension    Forearm supination    Forearm pronation    Wrist flexion    Wrist extension    Wrist ulnar deviation    Wrist radial deviation    (Blank rows = not tested)  HAND FUNCTION: Eval: Observed weakness in affected left hand as seen by stiffness and poor motion, details will be tested in future sessions when safe.  Grip strength Right: TBD lbs, Left: TBD lbs   COORDINATION: Eval: Observed coordination impairments with affected left hand.  Details will be tested in future sessions as time allows 9 Hole Peg Test Left: TBD sec (TBD sec is WFL)   SENSATION: Eval:  Light touch diminished around sx area and in the tips of digits 1 through 3 of the left  hand.  She states this is not significantly better since surgery yet.  She describes some hypersensitivity around her surgical sites  EDEMA:   Eval:  Mildly swollen in left hand and wrist today  OBSERVATIONS:   Eval: Surgical sites are healing up well, no signs of infection, have some hypersensitivity to touch, no dehiscence, overt stiffness and some anxiousness is present when trying to move her hand and arm.   Lt CTR and deQuervain's releases   TODAY'S TREATMENT:  06/15/23: She starts with active range of motion for exercise as well as new measures which shows improvement in wrist extension and forearm motion but not wrist flexion today.  OT then put  on moist heat for 3 minutes while reviewing home exercise program and educating on new upgrades for stretches as bolded below.  These were performed with her to ensure that she has no pain while doing them, and she performs back for understanding. OT also does manual therapy IASTM around thumb and 1st dorsal compartment to loosen her tension, perform myofascial release and decreased pain.  She states it does feel good and that she does have less pain afterwards.  She states understanding directions and to also wean from her prefab brace at home when just resting and she can also start to leave it off in the night if tolerated.   Exercises - Turn [employer] Facing Up & Down  - 4-6 x daily - 10-15 reps - Bend and Pull Back Wrist SLOWLY  - 4 x daily - 10-15 reps - "Windshield Wipers"   - 4 x daily - 10-15 reps - Tendon Glides  - 4-6 x daily - 3-5 reps - 2-3 seconds hold - Thumb Opposition  - 4-6 x daily - 10 reps - Wrist Flexion Stretch  - 4 x daily - 3-5 reps - 15 sec hold - Wrist Extension Stretch Pronated  - 4 x daily - 3-5 reps - 15 hold - Seated Composite Thumb Flexion PROM  - 2-3 x daily - 3-5 reps - 15 sec hold Patient Education - Scar Massage   PATIENT EDUCATION: Education details: See tx section above for details  Person educated:  Patient Education method: Verbal Instruction, Teach back, Handouts  Education comprehension: States and demonstrates understanding, Additional Education required    HOME EXERCISE PROGRAM: Access Code: [email or web address] URL: [email or web address] Date: 06/10/2023 Prepared by: Fannie Knee     GOALS: Goals reviewed with patient? Yes   SHORT TERM GOALS: (STG required if POC>30 days) Target Date: 06/25/2023  Pt will obtain protective, custom orthotic. Goal status: TBD/PRN  2.  Pt will demo/state understanding of initial HEP to improve pain levels and prerequisite motion. Goal status: INITIAL   LONG TERM GOALS: Target Date: 07/23/2023  Pt will improve functional ability by decreased impairment per Quick DASH assessment from 79.5% to 30% or better, for better quality of life. Goal status: INITIAL  2.  Pt will improve grip strength in left hand to at least 25 lbs for functional use at home and in IADLs. Goal status: INITIAL  3.  Pt will improve A/ROM in left wrist flexion/extension from 21/26 degrees respectively to at least 45 degrees each, to have functional motion for tasks like reach and grasp.  Goal status: INITIAL  4.  Pt will improve strength in left wrist flexion/extension from apparent 3 -/5 MMT to at least 4/5 MMT to have increased functional ability to carry out selfcare and higher-level homecare tasks with less difficulty. Goal status: INITIAL  5.  Pt will improve coordination skills in left dominant hand, as seen by within functional limit score on nine-hole peg testing to have increased functional ability to carry out fine motor tasks (fasteners, etc.) and more complex, coordinated IADLs (meal prep, sports, etc.).  Goal status: INITIAL  6.  Pt will decrease pain at worst from 7-8/10 to 3/10 or better to have better sleep and occupational participation in daily roles. Goal status: INITIAL   ASSESSMENT:  CLINICAL IMPRESSION: 06/15/23: She seems to do well  with exercises when in therapy and manual therapy and massage does help her symptoms and pain a lot.  It seems that she has more hypersensitivity now that is inhibiting her,  and she was encouraged to continue with touching and scar massages to help with this.  Eval: Patient is a 83 y.o. female who was seen today for occupational therapy evaluation for stiffness, weakness, pain, hypersensitivity and also paresthesia following left carpal tunnel release and de Quervain's releases.  She will benefit from outpatient occupational therapy to decrease symptoms and increase quality of life.      PLAN:  OT FREQUENCY: 1-2x/week  OT DURATION: 6 weeks through 07/23/2023 and up to 10 total visits as needed  PLANNED INTERVENTIONS: 97168 OT Re-evaluation, 97535 self care/ADL training, 16109 therapeutic exercise, 97530 therapeutic activity, 97112 neuromuscular re-education, 97140 manual therapy, 97035 ultrasound, 97039 fluidotherapy, 97010 moist heat, 97010 cryotherapy, 97760 Orthotics management and training, 60454 Subsequent splinting/medication, scar mobilization, passive range of motion, compression bandaging, Dry needling, coping strategies training, patient/family education, and DME and/or AE instructions  RECOMMENDED OTHER SERVICES: None now  CONSULTED AND AGREED WITH PLAN OF CARE: Patient  PLAN FOR NEXT SESSION:   Check new stretches, continue to wean from orthosis, try light functional activities to promote motion and use of arm.   Fannie Knee, OTR/L, CHT 06/15/2023, 4:39 PM

## 2023-06-15 ENCOUNTER — Encounter: Payer: Self-pay | Admitting: Rehabilitative and Restorative Service Providers"

## 2023-06-15 ENCOUNTER — Ambulatory Visit: Admitting: Rehabilitative and Restorative Service Providers"

## 2023-06-15 DIAGNOSIS — M6281 Muscle weakness (generalized): Secondary | ICD-10-CM | POA: Diagnosis not present

## 2023-06-15 DIAGNOSIS — M25532 Pain in left wrist: Secondary | ICD-10-CM | POA: Diagnosis not present

## 2023-06-15 DIAGNOSIS — R600 Localized edema: Secondary | ICD-10-CM

## 2023-06-15 DIAGNOSIS — M25632 Stiffness of left wrist, not elsewhere classified: Secondary | ICD-10-CM

## 2023-06-15 DIAGNOSIS — R278 Other lack of coordination: Secondary | ICD-10-CM

## 2023-06-21 ENCOUNTER — Other Ambulatory Visit (HOSPITAL_COMMUNITY): Payer: Self-pay

## 2023-06-21 MED ORDER — BACLOFEN 10 MG PO TABS
10.0000 mg | ORAL_TABLET | Freq: Three times a day (TID) | ORAL | 3 refills | Status: AC | PRN
  Filled 2023-06-21 – 2023-12-01 (×3): qty 270, 90d supply, fill #0

## 2023-06-21 MED ORDER — TRIAMTERENE-HCTZ 37.5-25 MG PO TABS
1.0000 | ORAL_TABLET | Freq: Every day | ORAL | 3 refills | Status: AC
  Filled 2023-06-21 – 2023-12-01 (×3): qty 90, 90d supply, fill #0

## 2023-06-21 MED ORDER — DILTIAZEM HCL ER COATED BEADS 360 MG PO CP24
360.0000 mg | ORAL_CAPSULE | Freq: Every day | ORAL | 3 refills | Status: AC
  Filled 2023-06-21 – 2023-12-01 (×3): qty 90, 90d supply, fill #0
  Filled 2024-04-11: qty 90, 90d supply, fill #1

## 2023-06-21 MED ORDER — MONTELUKAST SODIUM 10 MG PO TABS
10.0000 mg | ORAL_TABLET | Freq: Every day | ORAL | 3 refills | Status: AC
  Filled 2023-06-21 – 2023-06-28 (×2): qty 90, 90d supply, fill #0

## 2023-06-21 MED ORDER — ALBUTEROL SULFATE HFA 108 (90 BASE) MCG/ACT IN AERS
2.0000 | INHALATION_SPRAY | RESPIRATORY_TRACT | 6 refills | Status: AC | PRN
  Filled 2023-06-21: qty 6.7, 18d supply, fill #0
  Filled 2023-06-28: qty 6.7, 17d supply, fill #0

## 2023-06-21 MED ORDER — GABAPENTIN 300 MG PO CAPS
300.0000 mg | ORAL_CAPSULE | Freq: Four times a day (QID) | ORAL | 3 refills | Status: AC
  Filled 2023-06-21 – 2023-12-01 (×3): qty 360, 90d supply, fill #0

## 2023-06-21 MED ORDER — LORAZEPAM 1 MG PO TABS
1.0000 mg | ORAL_TABLET | Freq: Two times a day (BID) | ORAL | 0 refills | Status: AC
  Filled 2023-06-21: qty 60, 30d supply, fill #0

## 2023-06-22 ENCOUNTER — Other Ambulatory Visit: Payer: Self-pay

## 2023-06-22 ENCOUNTER — Other Ambulatory Visit (HOSPITAL_COMMUNITY): Payer: Self-pay

## 2023-06-23 ENCOUNTER — Encounter: Admitting: Rehabilitative and Restorative Service Providers"

## 2023-06-25 ENCOUNTER — Other Ambulatory Visit (HOSPITAL_COMMUNITY): Payer: Self-pay

## 2023-06-25 MED ORDER — HYDROCODONE-ACETAMINOPHEN 5-325 MG PO TABS
1.0000 | ORAL_TABLET | Freq: Four times a day (QID) | ORAL | 0 refills | Status: DC | PRN
  Filled 2023-06-25 (×2): qty 120, 30d supply, fill #0

## 2023-06-28 ENCOUNTER — Other Ambulatory Visit: Payer: Self-pay

## 2023-06-28 ENCOUNTER — Other Ambulatory Visit (HOSPITAL_COMMUNITY): Payer: Self-pay

## 2023-06-29 ENCOUNTER — Other Ambulatory Visit: Payer: Self-pay

## 2023-06-29 NOTE — Therapy (Signed)
 OUTPATIENT OCCUPATIONAL THERAPY TREATMENT NOTE   Patient Name: Charlotte Whitehead MRN: [id number] DOB:1941/01/25, 83 y.o., female Today's Date: 06/30/2023  PCP: Eliberto Grosser MD REFERRING PROVIDER: Merrill Abide, MD   END OF SESSION:  OT End of Session - 06/30/23 1334     Visit Number 3    Number of Visits 10    Date for OT Re-Evaluation 07/23/23    Authorization Type [employer]    OT Start Time 1340    OT Stop Time 1420    OT Time Calculation (min) 40 min    Equipment Utilized During Treatment yelow t-putty    Activity Tolerance Patient tolerated treatment well;No increased pain;Patient limited by fatigue    Behavior During Therapy [facility] for tasks assessed/performed             Past Medical History:  Diagnosis Date   Abdominal pain, unspecified site    Allergic rhinitis due to pollen    Anxiety    DJD (degenerative joint disease)    Enthesopathy of unspecified site    Essential hypertension, malignant    Extrinsic asthma, unspecified    Fecal incontinence    GERD (gastroesophageal reflux disease)    Headache(784.0)    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    Other bursitis disorders    Sleep apnea    no CPAP   Spinal stenosis, unspecified region other than cervical    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    Vertigo    Past Surgical History:  Procedure Laterality Date   BACK SURGERY  1980's   BREAST BIOPSY Right    40 years ago- thinks right - benign   CARDIAC CATHETERIZATION  2012   Dr Ruthy Cox intervention   CARPAL TUNNEL RELEASE Left 05/21/2023   Procedure: LEFT OPEN CARPAL TUNNEL RELEASE;  Surgeon: Merrill Abide, MD;  Location: [facility];  Service: Orthopedics;  Laterality: Left;   CHOLECYSTECTOMY     GALLBLADDER SURGERY  1990's   REPAIR EXTENSOR TENDON Left 05/21/2023   Procedure: LEFT WRIST FIRST EXTENSOR COMPARTMENT RELEASE;  Surgeon: Merrill Abide, MD;  Location: [facility];  Service: Orthopedics;  Laterality:  Left;   Patient Active Problem List   Diagnosis Date Noted   Carpal tunnel syndrome, left upper limb 05/21/2023   Eddie Good Quervain's disease (radial styloid tenosynovitis) 05/21/2023   Obstructive sleep apnea 08/13/2011   Nonallergic rhinitis 08/13/2011   Mild intermittent asthma 08/13/2011    ONSET DATE: DOS 05/21/23  REFERRING DIAG:  M65.4 (ICD-10-CM) - Eddie Good Quervain's disease (tenosynovitis)  G56.02 (ICD-10-CM) - Carpal tunnel syndrome, left upper limb    THERAPY DIAG:  Muscle weakness (generalized)  Localized edema  Pain in left wrist  Stiffness of left wrist, not elsewhere classified  Other lack of coordination  Rationale for Evaluation and Treatment: Rehabilitation  PERTINENT HISTORY: Past history of DJD, hypertension, cardiac catheterization and some other issues but no diabetes She states having pain ~1 year or so in the wrist and her fingers had been numb for several months before her surgery. She tried injection .   PRECAUTIONS: None  RED FLAGS: None   WEIGHT BEARING RESTRICTIONS: Yes recommended no heavy pushing pulling or gripping for the next 4 to 6 weeks (under 5 to 10 pounds is okay if not painful)    SUBJECTIVE:   SUBJECTIVE STATEMENT: Now  ~6 weeks s/p Lt CTR and deQuervain's releases.  She missed last week's session, today states still has some mild tingling in fingertips and base of thumb  feels stiff and tight still.     PAIN:  Are you having pain? Yes: NPRS scale: 0/10 at rest now and up to 7-8/10 at worst in past week Pain location: Left surgical sites at carpal tunnel and first extensor compartment Pain description: Aching and hypersensitive Aggravating factors: Gripping and squeezing Relieving factors: Rest    PATIENT GOALS: To improve use of left dominant hand and arm and decrease pain  NEXT MD VISIT: 07/12/2023    OBJECTIVE: (All objective assessments below are from initial evaluation on: 06/10/23 unless otherwise specified.)   HAND  DOMINANCE: LEFT  ADLs: Overall ADLs: States decreased ability to grab, hold household objects, pain and difficulty to open containers, perform FMS tasks (manipulate fasteners on clothing), mild to moderate bathing problems as well.    FUNCTIONAL OUTCOME MEASURES: Eval: Quick DASH 79.5% impairment today  (Higher % Score  =  More Impairment)     UPPER EXTREMITY ROM     Shoulder to Wrist AROM LEFT eval Lt 06/15/23 Lt 06/30/23  Forearm supination 88    Forearm pronation  70    Wrist flexion 21 15 31   Wrist extension 26 46 52  Wrist ulnar deviation 19    Wrist radial deviation 15    (Blank rows = not tested)   Hand AROM LEFT eval Lt 06/30/23  Full Fist Ability (or Gap to Distal Palmar Crease) Full fist but stiff   Thumb Opposition  (Kapandji Scale)  6/10 7/10  (Blank rows = not tested)   UPPER EXTREMITY MMT:    Eval:  NT at eval due to recent and still healing injuries, but grossly 3 -/5 at left hand and wrist now. Will be tested when appropriate.   MMT Right TBD Left TBD  Shoulder flexion    Shoulder abduction    Shoulder adduction    Shoulder extension    Shoulder internal rotation    Shoulder external rotation    Middle trapezius    Lower trapezius    Elbow flexion    Elbow extension    Forearm supination    Forearm pronation    Wrist flexion    Wrist extension    Wrist ulnar deviation    Wrist radial deviation    (Blank rows = not tested)  HAND FUNCTION: 06/30/23:  Grip strength Right: 35.7lbs, Left: 15 lbs   Eval: Observed weakness in affected left hand as seen by stiffness and poor motion, details will be tested in future sessions when safe.    COORDINATION: Eval: Observed coordination impairments with affected left hand.  Details will be tested in future sessions as time allows 9 Hole Peg Test Left: TBD sec (TBD sec is WFL)   SENSATION: Eval:  Light touch diminished around sx area and in the tips of digits 1 through 3 of the left hand.  She states  this is not significantly better since surgery yet.  She describes some hypersensitivity around her surgical sites  EDEMA:   Eval:  Mildly swollen in left hand and wrist today  OBSERVATIONS:   Eval: Surgical sites are healing up well, no signs of infection, have some hypersensitivity to touch, no dehiscence, overt stiffness and some anxiousness is present when trying to move her hand and arm.   Lt CTR and deQuervain's releases   TODAY'S TREATMENT:  06/30/23: She starts with AROM for new measures and exercises showing excellent improvement now.  She uses moist heat for 4 mins while OT reviews her HEP with her and updates  to include new t-putty activities as bolded below.  OT does manual therapy IASTM after she is off moist heat to move the tissues around her surgical site and up and down her arm.  She states this feels helpful and moist heat was also beneficial.  She then performs her stretch routine and active range of motion routine followed by new putty activities.  OT is there with her to ensure she does this well, and she handles it beautifully.  She is asked to do this new putty just once or twice a day to not "overdo it."  She leaves stating understanding and feeling better than when she came in.   Exercises - Bend and Pull Back Wrist SLOWLY  - 4 x daily - 10-15 reps - "Windshield Wipers"   - 4 x daily - 10-15 reps - Thumb Opposition  - 4-6 x daily - 10 reps - Wrist Flexion Stretch  - 4 x daily - 3-5 reps - 15 sec hold - Wrist Extension Stretch Pronated  - 4 x daily - 3-5 reps - 15 hold - Seated Composite Thumb Flexion PROM  - 2-3 x daily - 3-5 reps - 15 sec hold - Full Fist  - 2-3 x daily - 5 reps - Thumb Opposition with Putty  - 2-3 x daily - 5 reps - Thumb Press  - 2-3 x daily - 5 reps - Finger Pinch and Pull with Putty  - 2-3 x daily - 5 reps   PATIENT EDUCATION: Education details: See tx section above for details  Person educated: Patient Education method: Verbal Instruction,  Teach back, Handouts  Education comprehension: States and demonstrates understanding, Additional Education required    HOME EXERCISE PROGRAM: Access Code: [email or web address] URL: [email or web address] Date: 06/10/2023 Prepared by: Leartis Proud     GOALS: Goals reviewed with patient? Yes   SHORT TERM GOALS: (STG required if POC>30 days) Target Date: 06/25/2023  Pt will obtain protective, custom orthotic. Goal status: TBD/PRN  2.  Pt will demo/state understanding of initial HEP to improve pain levels and prerequisite motion. Goal status: 06/30/23: Met   LONG TERM GOALS: Target Date: 07/23/2023  Pt will improve functional ability by decreased impairment per Quick DASH assessment from 79.5% to 30% or better, for better quality of life. Goal status: INITIAL  2.  Pt will improve grip strength in left hand to at least 25 lbs for functional use at home and in IADLs. Goal status: INITIAL  3.  Pt will improve A/ROM in left wrist flexion/extension from 21/26 degrees respectively to at least 45 degrees each, to have functional motion for tasks like reach and grasp.  Goal status: INITIAL  4.  Pt will improve strength in left wrist flexion/extension from apparent 3 -/5 MMT to at least 4/5 MMT to have increased functional ability to carry out selfcare and higher-level homecare tasks with less difficulty. Goal status: INITIAL  5.  Pt will improve coordination skills in left dominant hand, as seen by within functional limit score on nine-hole peg testing to have increased functional ability to carry out fine motor tasks (fasteners, etc.) and more complex, coordinated IADLs (meal prep, sports, etc.).  Goal status: INITIAL  6.  Pt will decrease pain at worst from 7-8/10 to 3/10 or better to have better sleep and occupational participation in daily roles. Goal status: INITIAL   ASSESSMENT:  CLINICAL IMPRESSION: 06/30/23: Doing wonderfully, still has some nervous guarding habits,  but otherwise is tolerating stretching and new strengthening activities.  Carry  on  06/15/23: She seems to do well with exercises when in therapy and manual therapy and massage does help her symptoms and pain a lot.  It seems that she has more hypersensitivity now that is inhibiting her, and she was encouraged to continue with touching and scar massages to help with this.  Eval: Patient is a 83 y.o. female who was seen today for occupational therapy evaluation for stiffness, weakness, pain, hypersensitivity and also paresthesia following left carpal tunnel release and de Quervain's releases.  She will benefit from outpatient occupational therapy to decrease symptoms and increase quality of life.      PLAN:  OT FREQUENCY: 1-2x/week  OT DURATION: 6 weeks through 07/23/2023 and up to 10 total visits as needed  PLANNED INTERVENTIONS: 97168 OT Re-evaluation, 97535 self care/ADL training, 16109 therapeutic exercise, 97530 therapeutic activity, 97112 neuromuscular re-education, 97140 manual therapy, 97035 ultrasound, 97039 fluidotherapy, 97010 moist heat, 97010 cryotherapy, 97760 Orthotics management and training, 60454 Subsequent splinting/medication, scar mobilization, passive range of motion, compression bandaging, Dry needling, coping strategies training, patient/family education, and DME and/or AE instructions  RECOMMENDED OTHER SERVICES: None now  CONSULTED AND AGREED WITH PLAN OF CARE: Patient  PLAN FOR NEXT SESSION:   Check new putty activities, try to upgrade to light wrist strengthening as indicated   Leartis Proud, OTR/L, CHT 06/30/2023, 2:24 PM

## 2023-06-30 ENCOUNTER — Encounter: Payer: Self-pay | Admitting: Rehabilitative and Restorative Service Providers"

## 2023-06-30 ENCOUNTER — Ambulatory Visit: Admitting: Rehabilitative and Restorative Service Providers"

## 2023-06-30 DIAGNOSIS — M25532 Pain in left wrist: Secondary | ICD-10-CM

## 2023-06-30 DIAGNOSIS — R278 Other lack of coordination: Secondary | ICD-10-CM | POA: Diagnosis not present

## 2023-06-30 DIAGNOSIS — M6281 Muscle weakness (generalized): Secondary | ICD-10-CM | POA: Diagnosis not present

## 2023-06-30 DIAGNOSIS — M25632 Stiffness of left wrist, not elsewhere classified: Secondary | ICD-10-CM | POA: Diagnosis not present

## 2023-06-30 DIAGNOSIS — R600 Localized edema: Secondary | ICD-10-CM

## 2023-07-01 ENCOUNTER — Other Ambulatory Visit (HOSPITAL_COMMUNITY): Payer: Self-pay

## 2023-07-01 MED ORDER — AZITHROMYCIN 250 MG PO TABS
ORAL_TABLET | ORAL | 0 refills | Status: AC
  Filled 2023-07-01: qty 6, 5d supply, fill #0

## 2023-07-05 ENCOUNTER — Ambulatory Visit
Admission: RE | Admit: 2023-07-05 | Discharge: 2023-07-05 | Disposition: A | Discharge status: Home / Self Care | Source: Ambulatory Visit | Attending: Internal Medicine | Admitting: Internal Medicine

## 2023-07-05 ENCOUNTER — Other Ambulatory Visit (HOSPITAL_COMMUNITY): Payer: Self-pay

## 2023-07-05 ENCOUNTER — Other Ambulatory Visit: Payer: Self-pay | Admitting: Internal Medicine

## 2023-07-05 DIAGNOSIS — R109 Unspecified abdominal pain: Secondary | ICD-10-CM

## 2023-07-05 MED ORDER — QUETIAPINE FUMARATE 25 MG PO TABS
25.0000 mg | ORAL_TABLET | Freq: Every day | ORAL | 3 refills | Status: AC
Start: 2023-07-03 — End: ?
  Filled 2023-07-05 – 2023-12-01 (×2): qty 90, 90d supply, fill #0

## 2023-07-06 NOTE — Therapy (Signed)
 OUTPATIENT OCCUPATIONAL THERAPY TREATMENT NOTE   Patient Name: Charlotte Whitehead MRN: [id number] DOB:02-May-1940, 83 y.o., female Today's Date: 07/07/2023  PCP: Eliberto Grosser MD REFERRING PROVIDER: Merrill Abide, MD   END OF SESSION:  OT End of Session - 07/07/23 1354     Visit Number 4    Number of Visits 10    Date for OT Re-Evaluation 07/23/23    Authorization Type [employer]    OT Start Time 1354    OT Stop Time 1432    OT Time Calculation (min) 38 min    Equipment Utilized During Treatment --    Activity Tolerance Patient tolerated treatment well;No increased pain;Patient limited by fatigue    Behavior During Therapy [facility] for tasks assessed/performed              Past Medical History:  Diagnosis Date   Abdominal pain, unspecified site    Allergic rhinitis due to pollen    Anxiety    DJD (degenerative joint disease)    Enthesopathy of unspecified site    Essential hypertension, malignant    Extrinsic asthma, unspecified    Fecal incontinence    GERD (gastroesophageal reflux disease)    Headache(784.0)    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    Other bursitis disorders    Sleep apnea    no CPAP   Spinal stenosis, unspecified region other than cervical    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    Vertigo    Past Surgical History:  Procedure Laterality Date   BACK SURGERY  1980's   BREAST BIOPSY Right    40 years ago- thinks right - benign   CARDIAC CATHETERIZATION  2012   Dr Ruthy Cox intervention   CARPAL TUNNEL RELEASE Left 05/21/2023   Procedure: LEFT OPEN CARPAL TUNNEL RELEASE;  Surgeon: Merrill Abide, MD;  Location: [facility];  Service: Orthopedics;  Laterality: Left;   CHOLECYSTECTOMY     GALLBLADDER SURGERY  1990's   REPAIR EXTENSOR TENDON Left 05/21/2023   Procedure: LEFT WRIST FIRST EXTENSOR COMPARTMENT RELEASE;  Surgeon: Merrill Abide, MD;  Location: [facility];  Service: Orthopedics;  Laterality: Left;    Patient Active Problem List   Diagnosis Date Noted   Carpal tunnel syndrome, left upper limb 05/21/2023   Eddie Good Quervain's disease (radial styloid tenosynovitis) 05/21/2023   Obstructive sleep apnea 08/13/2011   Nonallergic rhinitis 08/13/2011   Mild intermittent asthma 08/13/2011    ONSET DATE: DOS 05/21/23  REFERRING DIAG:  M65.4 (ICD-10-CM) - Eddie Good Quervain's disease (tenosynovitis)  G56.02 (ICD-10-CM) - Carpal tunnel syndrome, left upper limb    THERAPY DIAG:  Muscle weakness (generalized)  Localized edema  Pain in left wrist  Other lack of coordination  Stiffness of left wrist, not elsewhere classified  Rationale for Evaluation and Treatment: Rehabilitation  PERTINENT HISTORY: Past history of DJD, hypertension, cardiac catheterization and some other issues but no diabetes She states having pain ~1 year or so in the wrist and her fingers had been numb for several months before her surgery. She tried injection .   PRECAUTIONS: None  RED FLAGS: None   WEIGHT BEARING RESTRICTIONS: Yes recommended no heavy pushing pulling or gripping for the next 4 to 6 weeks (under 5 to 10 pounds is okay if not painful)    SUBJECTIVE:   SUBJECTIVE STATEMENT: Now  ~7 weeks s/p Lt CTR and deQuervain's releases.  She states still having some nerve pain and hypersensitivity around the carpal tunnel release, but the de Quervain's  release is doing very well.    PAIN:  Are you having pain? Yes: NPRS scale:   0/10 at rest now and up to 6.5/10 at worst in past week Pain location: Left surgical sites at carpal tunnel and first extensor compartment Pain description: Aching and hypersensitive Aggravating factors: Gripping and squeezing Relieving factors: Rest    PATIENT GOALS: To improve use of left dominant hand and arm and decrease pain  NEXT MD VISIT: 07/12/2023    OBJECTIVE: (All objective assessments below are from initial evaluation on: 06/10/23 unless otherwise specified.)   HAND  DOMINANCE: LEFT  ADLs: Overall ADLs: States decreased ability to grab, hold household objects, pain and difficulty to open containers, perform FMS tasks (manipulate fasteners on clothing), mild to moderate bathing problems as well.    FUNCTIONAL OUTCOME MEASURES: Eval: Quick DASH 79.5% impairment today  (Higher % Score  =  More Impairment)     UPPER EXTREMITY ROM     Shoulder to Wrist AROM LEFT eval Lt 06/15/23 Lt 06/30/23 Lt 07/07/23  Forearm supination 88     Forearm pronation  70     Wrist flexion 21 15 31  44  Wrist extension 26 46 52 50  Wrist ulnar deviation 19     Wrist radial deviation 15     (Blank rows = not tested)   Hand AROM LEFT eval Lt 06/30/23  Full Fist Ability (or Gap to Distal Palmar Crease) Full fist but stiff   Thumb Opposition  (Kapandji Scale)  6/10 7/10  (Blank rows = not tested)   UPPER EXTREMITY MMT:    Eval:  NT at eval due to recent and still healing injuries, but grossly 3 -/5 at left hand and wrist now. Will be tested when appropriate.   MMT Right TBD Left TBD  Shoulder flexion    Shoulder abduction    Shoulder adduction    Shoulder extension    Shoulder internal rotation    Shoulder external rotation    Middle trapezius    Lower trapezius    Elbow flexion    Elbow extension    Forearm supination    Forearm pronation    Wrist flexion    Wrist extension    Wrist ulnar deviation    Wrist radial deviation    (Blank rows = not tested)  HAND FUNCTION: 07/07/23: Grip Lt: 19.4#   06/30/23:  Grip strength Right: 35.7lbs, Left: 15 lbs   Eval: Observed weakness in affected left hand as seen by stiffness and poor motion, details will be tested in future sessions when safe.    COORDINATION: 54/30/25: 9 Hole Peg Test Left: 43.8 sec (31 sec is WFL)    SENSATION: Eval:  Light touch diminished around sx area and in the tips of digits 1 through 3 of the left hand.  She states this is not significantly better since surgery yet.  She  describes some hypersensitivity around her surgical sites  EDEMA:   Eval:  Mildly swollen in left hand and wrist today  OBSERVATIONS:   Eval: Surgical sites are healing up well, no signs of infection, have some hypersensitivity to touch, no dehiscence, overt stiffness and some anxiousness is present when trying to move her hand and arm.   Lt CTR and deQuervain's releases   TODAY'S TREATMENT:  07/07/23: She performed motion and also gripping for check of status which shows improvement slightly for both.  To help with some pain and nerve sensitivity, OT performs manual therapy cupping and IASTM  through the thenar and hypothenar muscle groups.  She has some tenderness with this which fades immediately and she states feeling better afterwards. When pt performed 9HPT for functional activity, she was noted to leave out her Lt IF.  OT helps correct this by working on FMS with block "squirreled away" in Lt palm while performing lacing activity and putty pinching activities.  We then review her HEP, putty activities, and she does well with these, has no pain, and we review her stretches as well.  She was encouraged to start to use her index finger with activities, "test the waters" with functional activities as next week will be a progress report to determine if she needs any new therapy.    Exercises Reviewed Today  - Bend and Pull Back Wrist SLOWLY  - 4 x daily - 10-15 reps - "Windshield Wipers"   - 4 x daily - 10-15 reps - Thumb Opposition  - 4-6 x daily - 10 reps - Wrist Flexion Stretch  - 4 x daily - 3-5 reps - 15 sec hold - Wrist Extension Stretch Pronated  - 4 x daily - 3-5 reps - 15 hold - Seated Composite Thumb Flexion PROM  - 2-3 x daily - 3-5 reps - 15 sec hold - Full Fist  - 2-3 x daily - 5 reps - Thumb Opposition with Putty  - 2-3 x daily - 5 reps - Thumb Press  - 2-3 x daily - 5 reps - Finger Pinch and Pull with Putty  - 2-3 x daily - 5 reps   PATIENT EDUCATION: Education details: See  tx section above for details  Person educated: Patient Education method: Verbal Instruction, Teach back, Handouts  Education comprehension: States and demonstrates understanding, Additional Education required    HOME EXERCISE PROGRAM: Access Code: [email or web address] URL: [email or web address] Date: 06/10/2023 Prepared by: Leartis Proud     GOALS: Goals reviewed with patient? Yes   SHORT TERM GOALS: (STG required if POC>30 days) Target Date: 06/25/2023  Pt will obtain protective, custom orthotic. Goal status: TBD/PRN  2.  Pt will demo/state understanding of initial HEP to improve pain levels and prerequisite motion. Goal status: 06/30/23: Met   LONG TERM GOALS: Target Date: 07/23/2023  Pt will improve functional ability by decreased impairment per Quick DASH assessment from 79.5% to 30% or better, for better quality of life. Goal status: INITIAL  2.  Pt will improve grip strength in left hand to at least 25 lbs for functional use at home and in IADLs. Goal status: INITIAL  3.  Pt will improve A/ROM in left wrist flexion/extension from 21/26 degrees respectively to at least 45 degrees each, to have functional motion for tasks like reach and grasp.  Goal status: INITIAL  4.  Pt will improve strength in left wrist flexion/extension from apparent 3 -/5 MMT to at least 4/5 MMT to have increased functional ability to carry out selfcare and higher-level homecare tasks with less difficulty. Goal status: INITIAL  5.  Pt will improve coordination skills in left dominant hand, as seen by within functional limit score on nine-hole peg testing to have increased functional ability to carry out fine motor tasks (fasteners, etc.) and more complex, coordinated IADLs (meal prep, sports, etc.).  Goal status: INITIAL  6.  Pt will decrease pain at worst from 7-8/10 to 3/10 or better to have better sleep and occupational participation in daily roles. Goal status:  INITIAL   ASSESSMENT:  CLINICAL IMPRESSION: 07/07/23: She still having some sensitivity and  apprehension to using her hand to fully.  De Quervain's release is doing excellent but carpal tunnel area is still a bit sensitive.  She seemed to respond well to manual therapy today.     PLAN:  OT FREQUENCY: 1-2x/week  OT DURATION: 6 weeks through 07/23/2023 and up to 10 total visits as needed  PLANNED INTERVENTIONS: 97168 OT Re-evaluation, 97535 self care/ADL training, 82956 therapeutic exercise, 97530 therapeutic activity, 97112 neuromuscular re-education, 97140 manual therapy, 97035 ultrasound, 97039 fluidotherapy, 97010 moist heat, 97010 cryotherapy, 97760 Orthotics management and training, 21308 Subsequent splinting/medication, scar mobilization, passive range of motion, compression bandaging, Dry needling, coping strategies training, patient/family education, and DME and/or AE instructions  RECOMMENDED OTHER SERVICES: None now  CONSULTED AND AGREED WITH PLAN OF CARE: Patient  PLAN FOR NEXT SESSION:   PROG NOTE & try to upgrade to light wrist strengthening as indicated   Leartis Proud, OTR/L, CHT 07/07/2023, 3:44 PM

## 2023-07-07 ENCOUNTER — Other Ambulatory Visit (HOSPITAL_COMMUNITY): Payer: Self-pay

## 2023-07-07 ENCOUNTER — Ambulatory Visit: Admitting: Rehabilitative and Restorative Service Providers"

## 2023-07-07 ENCOUNTER — Encounter: Payer: Self-pay | Admitting: Rehabilitative and Restorative Service Providers"

## 2023-07-07 DIAGNOSIS — M6281 Muscle weakness (generalized): Secondary | ICD-10-CM | POA: Diagnosis not present

## 2023-07-07 DIAGNOSIS — R278 Other lack of coordination: Secondary | ICD-10-CM | POA: Diagnosis not present

## 2023-07-07 DIAGNOSIS — M25532 Pain in left wrist: Secondary | ICD-10-CM

## 2023-07-07 DIAGNOSIS — R600 Localized edema: Secondary | ICD-10-CM | POA: Diagnosis not present

## 2023-07-07 DIAGNOSIS — M25632 Stiffness of left wrist, not elsewhere classified: Secondary | ICD-10-CM

## 2023-07-12 ENCOUNTER — Ambulatory Visit (INDEPENDENT_AMBULATORY_CARE_PROVIDER_SITE_OTHER): Admitting: Orthopedic Surgery

## 2023-07-12 DIAGNOSIS — G5602 Carpal tunnel syndrome, left upper limb: Secondary | ICD-10-CM

## 2023-07-12 DIAGNOSIS — M654 Radial styloid tenosynovitis [de Quervain]: Secondary | ICD-10-CM

## 2023-07-12 NOTE — Progress Notes (Signed)
   Charlotte Whitehead - 83 y.o. female MRN [id number]  Date of birth: 1940-08-22  Office Visit Note: Visit Date: 07/12/2023 PCP: Ferrell Hu, MD Referred by: Ferrell Hu, MD  Subjective:  HPI: Charlotte Whitehead is a 83 y.o. female who presents today for follow up 7 weeks status post left wrist first extensor compartment release and open carpal tunnel release.  She is doing well overall, numbness and tingling is improving.  Pertinent ROS were reviewed with the patient and found to be negative unless otherwise specified above in HPI.   Assessment & Plan: Visit Diagnoses: No diagnosis found.  Plan: She continues to do well postoperatively.  She can continue with weekly occupational therapy as instructed and transition to home exercises when appropriate.  Her mobility and range of motion is excellent, see that she is having steady progress in terms of the numbness and tingling throughout the left hand.  Can continue to wean from the thumb spica brace as tolerated.  Follow-up in approximate 6 to 8 weeks.   Follow-up: No follow-ups on file.   Meds & Orders: No orders of the defined types were placed in this encounter.  No orders of the defined types were placed in this encounter.    Procedures: No procedures performed       Objective:   Vital Signs: There were no vitals taken for this visit.  Ortho Exam Left hand: - Well-healed palmar incision, well-healed dorsal radial incision over the wrist - Composite fist without restriction, thumb opposition to the small finger [facility] - Sensation intact to light touch in the median nerve distribution, slightly diminished at the distal aspect of the index finger - 4/5 APB mild thenar atrophy    Imaging: No results found.   Simren Popson Alvia Jointer, M.D. [facility], Hand Surgery

## 2023-07-14 ENCOUNTER — Ambulatory Visit: Admitting: Rehabilitative and Restorative Service Providers"

## 2023-07-14 ENCOUNTER — Encounter: Payer: Self-pay | Admitting: Rehabilitative and Restorative Service Providers"

## 2023-07-14 DIAGNOSIS — R600 Localized edema: Secondary | ICD-10-CM

## 2023-07-14 DIAGNOSIS — M25532 Pain in left wrist: Secondary | ICD-10-CM | POA: Diagnosis not present

## 2023-07-14 DIAGNOSIS — M6281 Muscle weakness (generalized): Secondary | ICD-10-CM

## 2023-07-14 DIAGNOSIS — R278 Other lack of coordination: Secondary | ICD-10-CM

## 2023-07-14 DIAGNOSIS — M25632 Stiffness of left wrist, not elsewhere classified: Secondary | ICD-10-CM

## 2023-07-14 NOTE — Therapy (Signed)
 OUTPATIENT OCCUPATIONAL THERAPY TREATMENT  NOTE   Patient Name: Charlotte Whitehead MRN: [id number] DOB:[birth year], 83 y.o., female Today's Date: 07/14/2023  PCP: Eliberto Grosser MD REFERRING PROVIDER: Merrill Abide, MD   END OF SESSION:  OT End of Session - 07/14/23 1343     Visit Number 5    Number of Visits 10    Date for OT Re-Evaluation 07/23/23    Authorization Type [employer]    OT Start Time 1344    OT Stop Time 1425    OT Time Calculation (min) 41 min    Activity Tolerance Patient tolerated treatment well;No increased pain;Patient limited by fatigue    Behavior During Therapy [facility] for tasks assessed/performed              Past Medical History:  Diagnosis Date   Abdominal pain, unspecified site    Allergic rhinitis due to pollen    Anxiety    DJD (degenerative joint disease)    Enthesopathy of unspecified site    Essential hypertension, malignant    Extrinsic asthma, unspecified    Fecal incontinence    GERD (gastroesophageal reflux disease)    Headache(784.0)    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    Other bursitis disorders    Sleep apnea    no CPAP   Spinal stenosis, unspecified region other than cervical    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    Vertigo    Past Surgical History:  Procedure Laterality Date   BACK SURGERY  1980's   BREAST BIOPSY Right    40 years ago- thinks right - benign   CARDIAC CATHETERIZATION  2012   Dr Ruthy Cox intervention   CARPAL TUNNEL RELEASE Left 05/21/2023   Procedure: LEFT OPEN CARPAL TUNNEL RELEASE;  Surgeon: Merrill Abide, MD;  Location: [facility];  Service: Orthopedics;  Laterality: Left;   CHOLECYSTECTOMY     GALLBLADDER SURGERY  1990's   REPAIR EXTENSOR TENDON Left 05/21/2023   Procedure: LEFT WRIST FIRST EXTENSOR COMPARTMENT RELEASE;  Surgeon: Merrill Abide, MD;  Location: [facility];  Service: Orthopedics;  Laterality: Left;   Patient Active Problem List   Diagnosis  Date Noted   Carpal tunnel syndrome, left upper limb 05/21/2023   Eddie Good Quervain's disease (radial styloid tenosynovitis) 05/21/2023   Obstructive sleep apnea 08/13/2011   Nonallergic rhinitis 08/13/2011   Mild intermittent asthma 08/13/2011    ONSET DATE: DOS 05/21/23  REFERRING DIAG:  M65.4 (ICD-10-CM) - Eddie Good Quervain's disease (tenosynovitis)  G56.02 (ICD-10-CM) - Carpal tunnel syndrome, left upper limb    THERAPY DIAG:  Muscle weakness (generalized)  Localized edema  Other lack of coordination  Pain in left wrist  Stiffness of left wrist, not elsewhere classified  Rationale for Evaluation and Treatment: Rehabilitation  PERTINENT HISTORY: Past history of DJD, hypertension, cardiac catheterization and some other issues but no diabetes She states having pain ~1 year or so in the wrist and her fingers had been numb for several months before her surgery. She tried injection .   PRECAUTIONS: None  RED FLAGS: None   WEIGHT BEARING RESTRICTIONS: Yes recommended no heavy pushing pulling or gripping for the next 4 to 6 weeks (under 5 to 10 pounds is okay if not painful)    SUBJECTIVE:   SUBJECTIVE STATEMENT: Now  ~8 weeks s/p Lt CTR and deQuervain's releases.  She states relatively high pain at rest today, arrives wearing pre-fab brace still.  She states MD appointment went well.  She has some difficulty  using her broom at home    PAIN:  Are you having pain? Yes: NPRS scale: 6/10 at rest now  Pain location: Left surgical sites at carpal tunnel and first extensor compartment Pain description: Aching and hypersensitive Aggravating factors: Gripping and squeezing Relieving factors: Rest    PATIENT GOALS: To improve use of left dominant hand and arm and decrease pain  NEXT MD VISIT: 07/12/2023    OBJECTIVE: (All objective assessments below are from initial evaluation on: 06/10/23 unless otherwise specified.)   HAND DOMINANCE: LEFT  ADLs: Overall ADLs: States decreased  ability to grab, hold household objects, pain and difficulty to open containers, perform FMS tasks (manipulate fasteners on clothing), mild to moderate bathing problems as well.    FUNCTIONAL OUTCOME MEASURES: Eval: Quick DASH 79.5% impairment today  (Higher % Score  =  More Impairment)     UPPER EXTREMITY ROM     Shoulder to Wrist AROM LEFT eval Lt 06/15/23 Lt 06/30/23 Lt 07/07/23 Lt 07/14/23  Forearm supination 88      Forearm pronation  70      Wrist flexion 21 15 31  44 35  Wrist extension 26 46 52 50 40  Wrist ulnar deviation 19      Wrist radial deviation 15      (Blank rows = not tested)   Hand AROM LEFT eval Lt 06/30/23 Lt 07/14/23  Full Fist Ability (or Gap to Distal Palmar Crease) Full fist but stiff  Full fist  Thumb Opposition  (Kapandji Scale)  6/10 7/10 8/10  (Blank rows = not tested)   UPPER EXTREMITY MMT:    Eval:  NT at eval due to recent and still healing injuries, but grossly 3 -/5 at left hand and wrist now. Will be tested when appropriate.   MMT Right TBD Left 07/21/23  Shoulder flexion    Shoulder abduction    Shoulder adduction    Shoulder extension    Shoulder internal rotation    Shoulder external rotation    Middle trapezius    Lower trapezius    Elbow flexion    Elbow extension    Forearm supination    Forearm pronation    Wrist flexion    Wrist extension    Wrist ulnar deviation    Wrist radial deviation    (Blank rows = not tested)  HAND FUNCTION: 07/14/23: Lt grip: 21.7#   07/07/23: Grip Lt: 19.4#   06/30/23:  Grip strength Right: 35.7lbs, Left: 15 lbs   Eval: Observed weakness in affected left hand as seen by stiffness and poor motion, details will be tested in future sessions when safe.    COORDINATION: 54/30/25: 9 Hole Peg Test Left: 43.8 sec (31 sec is WFL)    SENSATION: Eval:  Light touch diminished around sx area and in the tips of digits 1 through 3 of the left hand.  She states this is not significantly better since  surgery yet.  She describes some hypersensitivity around her surgical sites  EDEMA:   Eval:  Mildly swollen in left hand and wrist today  OBSERVATIONS:   Eval: Surgical sites are healing up well, no signs of infection, have some hypersensitivity to touch, no dehiscence, overt stiffness and some anxiousness is present when trying to move her hand and arm.   Lt CTR and deQuervain's releases   TODAY'S TREATMENT:  07/14/23: As she stays relatively high pain at rest today, OT works with her on desensitization with a vibration tool as well as instructing  her on supplement that she can use at home including brushing with a soft personal brush and using a terry cloth towel gently.  She states these do reduce the pain that she feels in her hand.  Next, OT advises her to discharge her prefabricated brace at all times as long as this is tolerated.  OT is concerned that she is staying stiff from wearing the brace too long, and it might even be squeezing her hand or her scars.    Next, we review her home exercise program, OT reviewing stretches with her and upgrading them somewhat-ensuring she is doing them well.  She has some tension and has difficulty relaxing while doing stretches.  With cues this is somewhat better.  OT also reviews therapy putty activities with her, upgrades her putty by adding some more resistive putty to it.  She tolerates it well, however.  At the end of the session, she performs a functional activity using a broom to simulate her desired home activity.  She does better with this activity after stretches and working with therapeutic activities with putty, so she was advised to "warm up" stretch and use putty at home before trying more difficult activities.  She states understanding, leaves in no significant pain not wearing her prefabricated brace.   Exercises - Wrist Flexion Stretch  - 3-4 x daily - 5 reps - 15 sec hold - Wrist Prayer Stretch  - 3-4 x daily - 5 reps - 15 sec hold -  Seated Composite Thumb Flexion PROM  - 2-3 x daily - 3-5 reps - 15 sec hold - Thumb Opposition  - 4-6 x daily - 10 reps - Finger Pinch and Pull with Putty  - 2-3 x daily - 5 reps - Full Fist  - 2-3 x daily - 5 reps - Thumb Opposition with Putty  - 2-3 x daily - 5 reps - Thumb Press  - 2-3 x daily - 5 reps   PATIENT EDUCATION: Education details: See tx section above for details  Person educated: Patient Education method: Engineer, structural, Teach back, Handouts  Education comprehension: States and demonstrates understanding, Additional Education required    HOME EXERCISE PROGRAM: Access Code: [email or web address] URL: [email or web address] Date: 06/10/2023 Prepared by: Leartis Proud     GOALS: Goals reviewed with patient? Yes   SHORT TERM GOALS: (STG required if POC>30 days) Target Date: 06/25/2023  Pt will obtain protective, custom orthotic. Goal status: TBD/PRN  2.  Pt will demo/state understanding of initial HEP to improve pain levels and prerequisite motion. Goal status: 06/30/23: Met   LONG TERM GOALS: Target Date: 07/23/2023  Pt will improve functional ability by decreased impairment per Quick DASH assessment from 79.5% to 30% or better, for better quality of life. Goal status:07/21/23: TBD  2.  Pt will improve grip strength in left hand to at least 25 lbs for functional use at home and in IADLs. Goal status:07/21/23: TBD  3.  Pt will improve A/ROM in left wrist flexion/extension from 21/26 degrees respectively to at least 45 degrees each, to have functional motion for tasks like reach and grasp.  Goal status:07/21/23: TBD  4.  Pt will improve strength in left wrist flexion/extension from apparent 3 -/5 MMT to at least 4/5 MMT to have increased functional ability to carry out selfcare and higher-level homecare tasks with less difficulty. Goal status: 07/21/23: TBD  5.  Pt will improve coordination skills in left dominant hand, as seen by within functional limit  score on nine-hole peg  testing to have increased functional ability to carry out fine motor tasks (fasteners, etc.) and more complex, coordinated IADLs (meal prep, sports, etc.).  Goal status:07/21/23: TBD  6.  Pt will decrease pain at worst from 7-8/10 to 3/10 or better to have better sleep and occupational participation in daily roles. Goal status: 07/21/23: TBD   ASSESSMENT:  CLINICAL IMPRESSION: 07/14/23: Although she came in relatively high pain, she also has some learned negative habits like guarding and wearing her brace for too long now.  OT is encouraging her to get rid of the brace and stop these guarding behaviors, and with some desensitization and training, she felt much better at the end of the session today.    PLAN:  OT FREQUENCY: 1-2x/week  OT DURATION: 6 weeks through 07/23/2023 and up to 10 total visits as needed  PLANNED INTERVENTIONS: 97168 OT Re-evaluation, 97535 self care/ADL training, 44010 therapeutic exercise, 97530 therapeutic activity, 97112 neuromuscular re-education, 97140 manual therapy, 97035 ultrasound, 97039 fluidotherapy, 97010 moist heat, 97010 cryotherapy, 97760 Orthotics management and training, 27253 Subsequent splinting/medication, scar mobilization, passive range of motion, compression bandaging, Dry needling, coping strategies training, patient/family education, and DME and/or AE instructions  RECOMMENDED OTHER SERVICES: None now  CONSULTED AND AGREED WITH PLAN OF CARE: Patient  PLAN FOR NEXT SESSION:   Progress note next week to determine the need for additional therapy services.   Leartis Proud, OTR/L, CHT 07/14/2023, 2:45 PM

## 2023-07-20 NOTE — Therapy (Signed)
 OUTPATIENT OCCUPATIONAL THERAPY TREATMENT & PROGRESS  NOTE   Patient Name: Charlotte Whitehead MRN: [id number] DOB:May 18, 1940, 83 y.o., female Today's Date: 07/21/2023  PCP: Eliberto Grosser MD REFERRING PROVIDER: Merrill Abide, MD               Progress Note Reporting Period 06/10/23 to 07/21/23   CLINICAL IMPRESSION: 07/21/23: She is now meant four of her 6 long-term goals, is doing much better, however she does have some weakness and pain with wrist flexion and some problems with her functional activities still.  She would like to continue on therapy for 2-3 more weeks to build her strength, decrease her pain and get her more functional.   PLAN:  OT FREQUENCY: 1x/week  OT DURATION: 3 additional weeks from 07/23/2023 - 08/13/23 and up to 9 total visits as needed  See note below for Objective Data and Assessment of Progress/Goals.                  END OF SESSION:  OT End of Session - 07/21/23 1303     Visit Number 6    Number of Visits 10    Date for OT Re-Evaluation 08/13/23    Authorization Type [employer]    OT Start Time 1303    OT Stop Time 1342    OT Time Calculation (min) 39 min    Activity Tolerance Patient tolerated treatment well;No increased pain;Patient limited by fatigue    Behavior During Therapy [facility] for tasks assessed/performed             Past Medical History:  Diagnosis Date   Abdominal pain, unspecified site    Allergic rhinitis due to pollen    Anxiety    DJD (degenerative joint disease)    Enthesopathy of unspecified site    Essential hypertension, malignant    Extrinsic asthma, unspecified    Fecal incontinence    GERD (gastroesophageal reflux disease)    Headache(784.0)    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    Other bursitis disorders    Sleep apnea    no CPAP   Spinal stenosis, unspecified region other than cervical    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    Vertigo    Past Surgical History:  Procedure  Laterality Date   BACK SURGERY  1980's   BREAST BIOPSY Right    40 years ago- thinks right - benign   CARDIAC CATHETERIZATION  2012   Dr Ruthy Cox intervention   CARPAL TUNNEL RELEASE Left 05/21/2023   Procedure: LEFT OPEN CARPAL TUNNEL RELEASE;  Surgeon: Merrill Abide, MD;  Location: [facility];  Service: Orthopedics;  Laterality: Left;   CHOLECYSTECTOMY     GALLBLADDER SURGERY  1990's   REPAIR EXTENSOR TENDON Left 05/21/2023   Procedure: LEFT WRIST FIRST EXTENSOR COMPARTMENT RELEASE;  Surgeon: Merrill Abide, MD;  Location: [facility];  Service: Orthopedics;  Laterality: Left;   Patient Active Problem List   Diagnosis Date Noted   Carpal tunnel syndrome, left upper limb 05/21/2023   De Quervain's disease (radial styloid tenosynovitis) 05/21/2023   Obstructive sleep apnea 08/13/2011   Nonallergic rhinitis 08/13/2011   Mild intermittent asthma 08/13/2011    ONSET DATE: DOS 05/21/23  REFERRING DIAG:  M65.4 (ICD-10-CM) - Eddie Good Quervain's disease (tenosynovitis)  G56.02 (ICD-10-CM) - Carpal tunnel syndrome, left upper limb    THERAPY DIAG:  Pain in left wrist - Plan: Ot plan of care cert/re-cert  Muscle weakness (generalized) - Plan: Ot plan of care cert/re-cert  Localized edema - Plan: Ot plan of care cert/re-cert  Stiffness of left wrist, not elsewhere classified - Plan: Ot plan of care cert/re-cert  Other lack of coordination - Plan: Ot plan of care cert/re-cert  Rationale for Evaluation and Treatment: Rehabilitation  PERTINENT HISTORY: Past history of DJD, hypertension, cardiac catheterization and some other issues but no diabetes She states having pain ~1 year or so in the wrist and her fingers had been numb for several months before her surgery. She tried injection .   PRECAUTIONS: None  RED FLAGS: None   WEIGHT BEARING RESTRICTIONS: Yes recommended no heavy pushing pulling or gripping for the next 4 to 6 weeks (under 5 to 10 pounds  is okay if not painful)    SUBJECTIVE:   SUBJECTIVE STATEMENT: Now  ~9 weeks s/p Lt CTR and deQuervain's releases.  She states not wearing her prefabricated brace now, more confident, more strong and moving better, however still having some pain with wrist flexion and when trying to carry or lift things.   PAIN:  Are you having pain? Yes: NPRS scale: 1-2/10 at rest now, up to 6/10 at worst in the past week trying to lift up a box Pain location: Left surgical sites at carpal tunnel and first extensor compartment Pain description: Aching and hypersensitive Aggravating factors: Gripping and squeezing Relieving factors: Rest    PATIENT GOALS: To improve use of left dominant hand and arm and decrease pain  NEXT MD VISIT: 07/12/2023    OBJECTIVE: (All objective assessments below are from initial evaluation on: 06/10/23 unless otherwise specified.)   HAND DOMINANCE: LEFT  ADLs: Overall ADLs: States decreased ability to grab, hold household objects, pain and difficulty to open containers, perform FMS tasks (manipulate fasteners on clothing), mild to moderate bathing problems as well.    FUNCTIONAL OUTCOME MEASURES: 07/21/23: Quick DASH: 27% impairment now    Eval: Quick DASH 79.5% impairment today  (Higher % Score  =  More Impairment)     UPPER EXTREMITY ROM     Shoulder to Wrist AROM LEFT eval Lt 06/15/23 Lt 06/30/23 Lt 07/07/23 Lt 07/14/23 Lt 07/21/23  Forearm supination 88       Forearm pronation  70       Wrist flexion 21 15 31  44 35 60  Wrist extension 26 46 52 50 40 59  Wrist ulnar deviation 19     45  Wrist radial deviation 15     20  (Blank rows = not tested)   Hand AROM LEFT eval Lt 06/30/23 Lt 07/14/23  Full Fist Ability (or Gap to Distal Palmar Crease) Full fist but stiff  Full fist  Thumb Opposition  (Kapandji Scale)  6/10 7/10 8/10  (Blank rows = not tested)   UPPER EXTREMITY MMT:     MMT Left 07/21/23  Shoulder flexion   Shoulder abduction   Shoulder  adduction   Shoulder extension   Shoulder internal rotation   Shoulder external rotation   Middle trapezius   Lower trapezius   Elbow flexion 4/5 (stiff in sh/neck)  Elbow extension 4+/5  Forearm supination   Forearm pronation   Wrist flexion 4-/5  Wrist extension 4+/5  Wrist ulnar deviation   Wrist radial deviation   (Blank rows = not tested)  HAND FUNCTION: 07/21/23: Grip Lt: 29.3#   07/14/23: Lt grip: 21.7#   07/07/23: Grip Lt: 19.4#   06/30/23:  Grip strength Right: 35.7lbs, Left: 15 lbs   Eval: Observed weakness in affected left hand as  seen by stiffness and poor motion, details will be tested in future sessions when safe.    COORDINATION: 07/21/23: 9HPT Rt: 25sec (31sec is WFL)    07/07/23: 9 Hole Peg Test Left: 43.8 sec (31 sec is WFL)    SENSATION: Eval:  Light touch diminished around sx area and in the tips of digits 1 through 3 of the left hand.  She states this is not significantly better since surgery yet.  She describes some hypersensitivity around her surgical sites  EDEMA:   Eval:  Mildly swollen in left hand and wrist today  OBSERVATIONS:   Eval: Surgical sites are healing up well, no signs of infection, have some hypersensitivity to touch, no dehiscence, overt stiffness and some anxiousness is present when trying to move her hand and arm.   Lt CTR and deQuervain's releases   TODAY'S TREATMENT:  07/21/23:   Pt performs AROM, gripping, and strength with left hand/wrist/arm against therapist's resistance for exercise/activities as well as new measures today. OT also discusses home and functional tasks with the pt and reviews goals.  While she is doing much better with everything, she still has some weakness and pain through the wrist and when doing functional activities.  Using the complied data, OT also reviews home exercises and provides updated recommendations and upgrades as below.  This includes new isometric wrist flexion strengthening exercise that is  bolded below.  Each of these exercises were done with her and she had no significant soreness or pain afterward.  She agrees to continue therapy for an additional couple weeks she did try to finish work on all of her long-term goals.  Additionally, for functional push and pull activity today she performs the UBE on 1.0 resistance for 3 minutes.  She tolerates this well and leaves in no significant pain.  Exercises - Wrist Flexion Stretch  - 3-4 x daily - 5 reps - 15 sec hold - Wrist Prayer Stretch  - 3-4 x daily - 5 reps - 15 sec hold - Seated Composite Thumb Flexion PROM  - 2-3 x daily - 3-5 reps - 15 sec hold - Finger Pinch and Pull with Putty  - 2-3 x daily - 5 reps - Full Fist  - 2-3 x daily - 5 reps - Thumb Opposition with Putty  - 2-3 x daily - 5 reps - Thumb Press  - 2-3 x daily - 5 reps - Seated Isometric Wrist Flexion Supinated with Manual Resistance  - 3-4 x daily - 5 reps - 5 seconds hold    07/14/23: As she stays relatively high pain at rest today, OT works with her on desensitization with a vibration tool as well as instructing her on supplement that she can use at home including brushing with a soft personal brush and using a terry cloth towel gently.  She states these do reduce the pain that she feels in her hand.  Next, OT advises her to discharge her prefabricated brace at all times as long as this is tolerated.  OT is concerned that she is staying stiff from wearing the brace too long, and it might even be squeezing her hand or her scars.    Next, we review her home exercise program, OT reviewing stretches with her and upgrading them somewhat-ensuring she is doing them well.  She has some tension and has difficulty relaxing while doing stretches.  With cues this is somewhat better.  OT also reviews therapy putty activities with her, upgrades her putty by  adding some more resistive putty to it.  She tolerates it well, however.  At the end of the session, she performs a functional  activity using a broom to simulate her desired home activity.  She does better with this activity after stretches and working with therapeutic activities with putty, so she was advised to "warm up" stretch and use putty at home before trying more difficult activities.  She states understanding, leaves in no significant pain not wearing her prefabricated brace.   Exercises - Wrist Flexion Stretch  - 3-4 x daily - 5 reps - 15 sec hold - Wrist Prayer Stretch  - 3-4 x daily - 5 reps - 15 sec hold - Seated Composite Thumb Flexion PROM  - 2-3 x daily - 3-5 reps - 15 sec hold - Thumb Opposition  - 4-6 x daily - 10 reps - Finger Pinch and Pull with Putty  - 2-3 x daily - 5 reps - Full Fist  - 2-3 x daily - 5 reps - Thumb Opposition with Putty  - 2-3 x daily - 5 reps - Thumb Press  - 2-3 x daily - 5 reps   PATIENT EDUCATION: Education details: See tx section above for details  Person educated: Patient Education method: Engineer, structural, Teach back, Handouts  Education comprehension: States and demonstrates understanding, Additional Education required    HOME EXERCISE PROGRAM: Access Code: [email or web address] URL: [email or web address] Date: 06/10/2023 Prepared by: Leartis Proud     GOALS: Goals reviewed with patient? Yes   SHORT TERM GOALS: (STG required if POC>30 days) Target Date: 06/25/2023  Pt will obtain protective, custom orthotic. Goal status: TBD/PRN  2.  Pt will demo/state understanding of initial HEP to improve pain levels and prerequisite motion. Goal status: 06/30/23: Met   LONG TERM GOALS: Target Date: 08/13/2023  Pt will improve functional ability by decreased impairment per Quick DASH assessment from 79.5% to 30% or better, for better quality of life. Goal status:07/21/23: MET  2.  Pt will improve grip strength in left hand to at least 25 lbs for functional use at home and in IADLs. Goal status:07/21/23: MET  3.  Pt will improve A/ROM in left wrist  flexion/extension from 21/26 degrees respectively to at least 45 degrees each, to have functional motion for tasks like reach and grasp.  Goal status:07/21/23: MET  4.  Pt will improve strength in left wrist flexion/extension from apparent 3 -/5 MMT to at least 4/5 MMT to have increased functional ability to carry out selfcare and higher-level homecare tasks with less difficulty. Goal status: 07/21/23: Partially MET- still a bit weak in wrist flexion  5.  Pt will improve coordination skills in left dominant hand, as seen by within functional limit score on nine-hole peg testing to have increased functional ability to carry out fine motor tasks (fasteners, etc.) and more complex, coordinated IADLs (meal prep, sports, etc.).  Goal status:07/21/23: MET  6.  Pt will decrease pain at worst from 7-8/10 to 3/10 or better to have better sleep and occupational participation in daily roles. Goal status: 07/21/23: improved to 6/10 but not met    ASSESSMENT:  CLINICAL IMPRESSION: 07/21/23: She is now meant four of her 6 long-term goals, is doing much better, however she does have some weakness and pain with wrist flexion and some problems with her functional activities still.  She would like to continue on therapy for 2-3 more weeks to build her strength, decrease her pain and get her more functional.   PLAN:  OT FREQUENCY: 1x/week  OT DURATION: 3 additional weeks from 07/23/2023 - 08/13/23 and up to 9 total visits as needed  PLANNED INTERVENTIONS: 97168 OT Re-evaluation, 97535 self care/ADL training, 02725 therapeutic exercise, 97530 therapeutic activity, 97112 neuromuscular re-education, 97140 manual therapy, 97035 ultrasound, 97039 fluidotherapy, 97010 moist heat, 97010 cryotherapy, 97760 Orthotics management and training, [id number] Subsequent splinting/medication, scar mobilization, passive range of motion, compression bandaging, Dry needling, coping strategies training, patient/family education, and DME and/or  AE instructions  RECOMMENDED OTHER SERVICES: None now  CONSULTED AND AGREED WITH PLAN OF CARE: Patient  PLAN FOR NEXT SESSION:   Continue to work on volar wrist strengthening as well as grip training and functional push and pull and other activities.   Leartis Proud, OTR/L, CHT 07/21/2023, 3:06 PM

## 2023-07-21 ENCOUNTER — Encounter: Payer: Self-pay | Admitting: Rehabilitative and Restorative Service Providers"

## 2023-07-21 ENCOUNTER — Encounter: Payer: Self-pay | Admitting: Internal Medicine

## 2023-07-21 ENCOUNTER — Ambulatory Visit: Admitting: Rehabilitative and Restorative Service Providers"

## 2023-07-21 DIAGNOSIS — M6281 Muscle weakness (generalized): Secondary | ICD-10-CM | POA: Diagnosis not present

## 2023-07-21 DIAGNOSIS — R600 Localized edema: Secondary | ICD-10-CM

## 2023-07-21 DIAGNOSIS — M25532 Pain in left wrist: Secondary | ICD-10-CM

## 2023-07-21 DIAGNOSIS — M25632 Stiffness of left wrist, not elsewhere classified: Secondary | ICD-10-CM

## 2023-07-21 DIAGNOSIS — R278 Other lack of coordination: Secondary | ICD-10-CM

## 2023-07-23 ENCOUNTER — Other Ambulatory Visit: Payer: Self-pay | Admitting: Internal Medicine

## 2023-07-23 DIAGNOSIS — R159 Full incontinence of feces: Secondary | ICD-10-CM

## 2023-07-23 DIAGNOSIS — R0989 Other specified symptoms and signs involving the circulatory and respiratory systems: Secondary | ICD-10-CM

## 2023-07-23 DIAGNOSIS — R1013 Epigastric pain: Secondary | ICD-10-CM

## 2023-07-23 DIAGNOSIS — R590 Localized enlarged lymph nodes: Secondary | ICD-10-CM

## 2023-07-23 DIAGNOSIS — R109 Unspecified abdominal pain: Secondary | ICD-10-CM

## 2023-07-27 NOTE — Therapy (Signed)
 OUTPATIENT OCCUPATIONAL THERAPY TREATMENT NOTE   Patient Name: Charlotte Whitehead MRN: [id number] DOB:[birth year], 83 y.o., female Today's Date: 07/28/2023  PCP: Eliberto Grosser MD REFERRING PROVIDER: Merrill Abide, MD      END OF SESSION:  OT End of Session - 07/28/23 1020     Visit Number 7    Number of Visits 10    Date for OT Re-Evaluation 08/13/23    Authorization Type [employer]    OT Start Time 1020    OT Stop Time 1102    OT Time Calculation (min) 42 min    Activity Tolerance Patient tolerated treatment well;No increased pain;Patient limited by fatigue    Behavior During Therapy [facility] for tasks assessed/performed              Past Medical History:  Diagnosis Date   Abdominal pain, unspecified site    Allergic rhinitis due to pollen    Anxiety    DJD (degenerative joint disease)    Enthesopathy of unspecified site    Essential hypertension, malignant    Extrinsic asthma, unspecified    Fecal incontinence    GERD (gastroesophageal reflux disease)    Headache(784.0)    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    Other bursitis disorders    Sleep apnea    no CPAP   Spinal stenosis, unspecified region other than cervical    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    Vertigo    Past Surgical History:  Procedure Laterality Date   BACK SURGERY  1980's   BREAST BIOPSY Right    40 years ago- thinks right - benign   CARDIAC CATHETERIZATION  2012   Dr Ruthy Cox intervention   CARPAL TUNNEL RELEASE Left 05/21/2023   Procedure: LEFT OPEN CARPAL TUNNEL RELEASE;  Surgeon: Merrill Abide, MD;  Location: [facility];  Service: Orthopedics;  Laterality: Left;   CHOLECYSTECTOMY     GALLBLADDER SURGERY  1990's   REPAIR EXTENSOR TENDON Left 05/21/2023   Procedure: LEFT WRIST FIRST EXTENSOR COMPARTMENT RELEASE;  Surgeon: Merrill Abide, MD;  Location: [facility];  Service: Orthopedics;  Laterality: Left;   Patient Active Problem List    Diagnosis Date Noted   Carpal tunnel syndrome, left upper limb 05/21/2023   Eddie Good Quervain's disease (radial styloid tenosynovitis) 05/21/2023   Obstructive sleep apnea 08/13/2011   Nonallergic rhinitis 08/13/2011   Mild intermittent asthma 08/13/2011    ONSET DATE: DOS 05/21/23  REFERRING DIAG:  M65.4 (ICD-10-CM) - Eddie Good Quervain's disease (tenosynovitis)  G56.02 (ICD-10-CM) - Carpal tunnel syndrome, left upper limb    THERAPY DIAG:  Muscle weakness (generalized)  Pain in left wrist  Stiffness of left wrist, not elsewhere classified  Localized edema  Other lack of coordination  Rationale for Evaluation and Treatment: Rehabilitation  PERTINENT HISTORY: Past history of DJD, hypertension, cardiac catheterization and some other issues but no diabetes She states having pain ~1 year or so in the wrist and her fingers had been numb for several months before her surgery. She tried injection .   PRECAUTIONS: None  RED FLAGS: None   WEIGHT BEARING RESTRICTIONS: Yes recommended no heavy pushing pulling or gripping for the next 4 to 6 weeks (under 5 to 10 pounds is okay if not painful)    SUBJECTIVE:   SUBJECTIVE STATEMENT: Now  ~10 weeks s/p Lt CTR and deQuervain's releases.  She states surprising amount of pain at rest today though she exhibits no overt pain signs.  This is somewhat common for her.  She cannot really state what makes her pain, or go, but she has been trying to use her hand more a bit which is good    PAIN:  Are you having pain? Yes: NPRS scale:  4-5/10 at rest now, and a light paresthesia reported today  Pain location: Left surgical sites at carpal tunnel and first extensor compartment Pain description: Aching and hypersensitive Aggravating factors: Gripping and squeezing Relieving factors: Rest    PATIENT GOALS: To improve use of left dominant hand and arm and decrease pain  NEXT MD VISIT: 07/12/2023    OBJECTIVE: (All objective assessments below are from  initial evaluation on: 06/10/23 unless otherwise specified.)   HAND DOMINANCE: LEFT  ADLs: Overall ADLs: States decreased ability to grab, hold household objects, pain and difficulty to open containers, perform FMS tasks (manipulate fasteners on clothing), mild to moderate bathing problems as well.    FUNCTIONAL OUTCOME MEASURES: 07/21/23: Quick DASH: 27% impairment now    Eval: Quick DASH 79.5% impairment today  (Higher % Score  =  More Impairment)     UPPER EXTREMITY ROM     Shoulder to Wrist AROM LEFT eval Lt 06/15/23 Lt 06/30/23 Lt 07/07/23 Lt 07/14/23 Lt 07/21/23  Forearm supination 88       Forearm pronation  70       Wrist flexion 21 15 31  44 35 60  Wrist extension 26 46 52 50 40 59  Wrist ulnar deviation 19     45  Wrist radial deviation 15     20  (Blank rows = not tested)   Hand AROM LEFT eval Lt 06/30/23 Lt 07/14/23  Full Fist Ability (or Gap to Distal Palmar Crease) Full fist but stiff  Full fist  Thumb Opposition  (Kapandji Scale)  6/10 7/10 8/10  (Blank rows = not tested)   UPPER EXTREMITY MMT:     MMT Left 07/21/23  Shoulder flexion   Shoulder abduction   Shoulder adduction   Shoulder extension   Shoulder internal rotation   Shoulder external rotation   Middle trapezius   Lower trapezius   Elbow flexion 4/5 (stiff in sh/neck)  Elbow extension 4+/5  Forearm supination   Forearm pronation   Wrist flexion 4-/5  Wrist extension 4+/5  Wrist ulnar deviation   Wrist radial deviation   (Blank rows = not tested)  HAND FUNCTION: 07/21/23: Grip Lt: 29.3#   07/14/23: Lt grip: 21.7#   07/07/23: Grip Lt: 19.4#   06/30/23:  Grip strength Right: 35.7lbs, Left: 15 lbs   Eval: Observed weakness in affected left hand as seen by stiffness and poor motion, details will be tested in future sessions when safe.    COORDINATION: 07/21/23: 9HPT Rt: 25sec (31sec is WFL)    07/07/23: 9 Hole Peg Test Left: 43.8 sec (31 sec is WFL)    SENSATION: Eval:  Light touch  diminished around sx area and in the tips of digits 1 through 3 of the left hand.  She states this is not significantly better since surgery yet.  She describes some hypersensitivity around her surgical sites  EDEMA:   Eval:  Mildly swollen in left hand and wrist today  OBSERVATIONS:   Eval: Surgical sites are healing up well, no signs of infection, have some hypersensitivity to touch, no dehiscence, overt stiffness and some anxiousness is present when trying to move her hand and arm.   Lt CTR and deQuervain's releases   TODAY'S TREATMENT:  07/28/23: To try to encourage the use  of her hand, OT pushes her a bit today to do dynamic functional activities and strengthening as listed below.  We start with moist heat while OT explains and reviews her home exercises concurrently.  She then uses the UB E for functional push and pull followed by flex bar activities as well as Velcro board activities, therapy putty activities to simulate cooking tasks and other home tasks as listed below.  At the end of the session she states these things were difficult for her and mildly painful, though she feels that she has more confidence and can do more things at home than she thought.   UBE x 4.5 mins 2.0 resistance at least 40 RPM   Yellow flexbar pronation x10   Yellow Flexbar Supination x10   Yellow flexbar wrist flex/ext x10  Velcro Board for turning can opener and using mixing spoon  Cutting pink therapy putty  Mashing potatoes fnl activity       Continue to work on volar wrist strengthening as well as grip training and functional push and pull and other activities.22   07/21/23:   Pt performs AROM, gripping, and strength with left hand/wrist/arm against therapist's resistance for exercise/activities as well as new measures today. OT also discusses home and functional tasks with the pt and reviews goals.  While she is doing much better with everything, she still has some weakness and pain through the wrist  and when doing functional activities.  Using the complied data, OT also reviews home exercises and provides updated recommendations and upgrades as below.  This includes new isometric wrist flexion strengthening exercise that is bolded below.  Each of these exercises were done with her and she had no significant soreness or pain afterward.  She agrees to continue therapy for an additional couple weeks she did try to finish work on all of her long-term goals.  Additionally, for functional push and pull activity today she performs the UBE on 1.0 resistance for 3 minutes.  She tolerates this well and leaves in no significant pain.  Exercises - Wrist Flexion Stretch  - 3-4 x daily - 5 reps - 15 sec hold - Wrist Prayer Stretch  - 3-4 x daily - 5 reps - 15 sec hold - Seated Composite Thumb Flexion PROM  - 2-3 x daily - 3-5 reps - 15 sec hold - Finger Pinch and Pull with Putty  - 2-3 x daily - 5 reps - Full Fist  - 2-3 x daily - 5 reps - Thumb Opposition with Putty  - 2-3 x daily - 5 reps - Thumb Press  - 2-3 x daily - 5 reps - Seated Isometric Wrist Flexion Supinated with Manual Resistance  - 3-4 x daily - 5 reps - 5 seconds hold    07/14/23: As she stays relatively high pain at rest today, OT works with her on desensitization with a vibration tool as well as instructing her on supplement that she can use at home including brushing with a soft personal brush and using a terry cloth towel gently.  She states these do reduce the pain that she feels in her hand.  Next, OT advises her to discharge her prefabricated brace at all times as long as this is tolerated.  OT is concerned that she is staying stiff from wearing the brace too long, and it might even be squeezing her hand or her scars.    Next, we review her home exercise program, OT reviewing stretches with her and upgrading them  somewhat-ensuring she is doing them well.  She has some tension and has difficulty relaxing while doing stretches.  With cues  this is somewhat better.  OT also reviews therapy putty activities with her, upgrades her putty by adding some more resistive putty to it.  She tolerates it well, however.  At the end of the session, she performs a functional activity using a broom to simulate her desired home activity.  She does better with this activity after stretches and working with therapeutic activities with putty, so she was advised to "warm up" stretch and use putty at home before trying more difficult activities.  She states understanding, leaves in no significant pain not wearing her prefabricated brace.   Exercises - Wrist Flexion Stretch  - 3-4 x daily - 5 reps - 15 sec hold - Wrist Prayer Stretch  - 3-4 x daily - 5 reps - 15 sec hold - Seated Composite Thumb Flexion PROM  - 2-3 x daily - 3-5 reps - 15 sec hold - Thumb Opposition  - 4-6 x daily - 10 reps - Finger Pinch and Pull with Putty  - 2-3 x daily - 5 reps - Full Fist  - 2-3 x daily - 5 reps - Thumb Opposition with Putty  - 2-3 x daily - 5 reps - Thumb Press  - 2-3 x daily - 5 reps   PATIENT EDUCATION: Education details: See tx section above for details  Person educated: Patient Education method: Engineer, structural, Teach back, Handouts  Education comprehension: States and demonstrates understanding, Additional Education required    HOME EXERCISE PROGRAM: Access Code: [email or web address] URL: [email or web address] Date: 06/10/2023 Prepared by: Leartis Proud     GOALS: Goals reviewed with patient? Yes   SHORT TERM GOALS: (STG required if POC>30 days) Target Date: 06/25/2023  Pt will obtain protective, custom orthotic. Goal status: TBD/PRN  2.  Pt will demo/state understanding of initial HEP to improve pain levels and prerequisite motion. Goal status: 06/30/23: Met   LONG TERM GOALS: Target Date: 08/13/2023  Pt will improve functional ability by decreased impairment per Quick DASH assessment from 79.5% to 30% or better, for better  quality of life. Goal status:07/21/23: MET  2.  Pt will improve grip strength in left hand to at least 25 lbs for functional use at home and in IADLs. Goal status:07/21/23: MET  3.  Pt will improve A/ROM in left wrist flexion/extension from 21/26 degrees respectively to at least 45 degrees each, to have functional motion for tasks like reach and grasp.  Goal status:07/21/23: MET  4.  Pt will improve strength in left wrist flexion/extension from apparent 3 -/5 MMT to at least 4/5 MMT to have increased functional ability to carry out selfcare and higher-level homecare tasks with less difficulty. Goal status: 07/21/23: Partially MET- still a bit weak in wrist flexion  5.  Pt will improve coordination skills in left dominant hand, as seen by within functional limit score on nine-hole peg testing to have increased functional ability to carry out fine motor tasks (fasteners, etc.) and more complex, coordinated IADLs (meal prep, sports, etc.).  Goal status:07/21/23: MET  6.  Pt will decrease pain at worst from 7-8/10 to 3/10 or better to have better sleep and occupational participation in daily roles. Goal status: 07/21/23: improved to 6/10 but not met    ASSESSMENT:  CLINICAL IMPRESSION: 07/28/23: Pushing her a bit with functional activities was beneficial for her today and she performed better than she anticipated.  She has a  lot of fear and apprehension, and OT will help her get over this in the next 1-2 more sessions hopefully  07/21/23: She is now meant four of her 6 long-term goals, is doing much better, however she does have some weakness and pain with wrist flexion and some problems with her functional activities still.  She would like to continue on therapy for 2-3 more weeks to build her strength, decrease her pain and get her more functional.   PLAN:  OT FREQUENCY: 1x/week  OT DURATION: 3 additional weeks from 07/23/2023 - 08/13/23 and up to 9 total visits as needed  PLANNED INTERVENTIONS:  97168 OT Re-evaluation, 97535 self care/ADL training, 16109 therapeutic exercise, 97530 therapeutic activity, 97112 neuromuscular re-education, 97140 manual therapy, 97035 ultrasound, 97039 fluidotherapy, 97010 moist heat, 97010 cryotherapy, 97760 Orthotics management and training, 60454 Subsequent splinting/medication, scar mobilization, passive range of motion, compression bandaging, Dry needling, coping strategies training, patient/family education, and DME and/or AE instructions  RECOMMENDED OTHER SERVICES: None now  CONSULTED AND AGREED WITH PLAN OF CARE: Patient  PLAN FOR NEXT SESSION:   Continue to perform functional activities and even try light weight training at the wrist and arm   Leartis Proud, OTR/L, CHT 07/28/2023, 4:04 PM

## 2023-07-28 ENCOUNTER — Encounter: Payer: Self-pay | Admitting: Rehabilitative and Restorative Service Providers"

## 2023-07-28 ENCOUNTER — Ambulatory Visit: Admitting: Rehabilitative and Restorative Service Providers"

## 2023-07-28 DIAGNOSIS — R600 Localized edema: Secondary | ICD-10-CM

## 2023-07-28 DIAGNOSIS — M6281 Muscle weakness (generalized): Secondary | ICD-10-CM | POA: Diagnosis not present

## 2023-07-28 DIAGNOSIS — M25532 Pain in left wrist: Secondary | ICD-10-CM

## 2023-07-28 DIAGNOSIS — M25632 Stiffness of left wrist, not elsewhere classified: Secondary | ICD-10-CM

## 2023-07-28 DIAGNOSIS — R278 Other lack of coordination: Secondary | ICD-10-CM

## 2023-08-03 ENCOUNTER — Ambulatory Visit
Admission: RE | Admit: 2023-08-03 | Discharge: 2023-08-03 | Disposition: A | Discharge status: Home / Self Care | Source: Ambulatory Visit | Attending: Internal Medicine | Admitting: Internal Medicine

## 2023-08-03 ENCOUNTER — Other Ambulatory Visit (HOSPITAL_COMMUNITY): Payer: Self-pay

## 2023-08-03 DIAGNOSIS — R109 Unspecified abdominal pain: Secondary | ICD-10-CM

## 2023-08-03 DIAGNOSIS — R1013 Epigastric pain: Secondary | ICD-10-CM

## 2023-08-03 DIAGNOSIS — R590 Localized enlarged lymph nodes: Secondary | ICD-10-CM

## 2023-08-03 DIAGNOSIS — R159 Full incontinence of feces: Secondary | ICD-10-CM

## 2023-08-03 DIAGNOSIS — R0989 Other specified symptoms and signs involving the circulatory and respiratory systems: Secondary | ICD-10-CM

## 2023-08-03 MED ORDER — HYDROCODONE-ACETAMINOPHEN 5-325 MG PO TABS
1.0000 | ORAL_TABLET | Freq: Four times a day (QID) | ORAL | 0 refills | Status: DC | PRN
  Filled 2023-08-03: qty 120, 30d supply, fill #0

## 2023-08-03 MED ORDER — IOPAMIDOL (ISOVUE-300) INJECTION 61%
100.0000 mL | Freq: Once | INTRAVENOUS | Status: AC | PRN
  Administered 2023-08-03: 100 mL via INTRAVENOUS

## 2023-08-04 ENCOUNTER — Encounter: Payer: Self-pay | Admitting: Rehabilitative and Restorative Service Providers"

## 2023-08-04 ENCOUNTER — Ambulatory Visit: Admitting: Rehabilitative and Restorative Service Providers"

## 2023-08-04 ENCOUNTER — Other Ambulatory Visit (HOSPITAL_COMMUNITY): Payer: Self-pay

## 2023-08-04 DIAGNOSIS — R600 Localized edema: Secondary | ICD-10-CM | POA: Diagnosis not present

## 2023-08-04 DIAGNOSIS — R278 Other lack of coordination: Secondary | ICD-10-CM

## 2023-08-04 DIAGNOSIS — M6281 Muscle weakness (generalized): Secondary | ICD-10-CM | POA: Diagnosis not present

## 2023-08-04 DIAGNOSIS — M25532 Pain in left wrist: Secondary | ICD-10-CM | POA: Diagnosis not present

## 2023-08-04 DIAGNOSIS — M25632 Stiffness of left wrist, not elsewhere classified: Secondary | ICD-10-CM | POA: Diagnosis not present

## 2023-08-04 NOTE — Therapy (Signed)
 OUTPATIENT OCCUPATIONAL THERAPY TREATMENT NOTE   Patient Name: Charlotte Whitehead MRN: [id number] DOB:Nov 23, 1940, 83 y.o., female Today's Date: 08/04/2023  PCP: Eliberto Grosser MD REFERRING PROVIDER: Merrill Abide, MD      END OF SESSION:  OT End of Session - 08/04/23 1609     Visit Number 8    Number of Visits 10    Date for OT Re-Evaluation 08/13/23    Authorization Type [employer]    OT Start Time 1609    OT Stop Time 1642    OT Time Calculation (min) 33 min    Equipment Utilized During Treatment red t-bands    Activity Tolerance Patient tolerated treatment well;No increased pain;Patient limited by fatigue    Behavior During Therapy [facility] for tasks assessed/performed               Past Medical History:  Diagnosis Date   Abdominal pain, unspecified site    Allergic rhinitis due to pollen    Anxiety    DJD (degenerative joint disease)    Enthesopathy of unspecified site    Essential hypertension, malignant    Extrinsic asthma, unspecified    Fecal incontinence    GERD (gastroesophageal reflux disease)    Headache(784.0)    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    Other bursitis disorders    Sleep apnea    no CPAP   Spinal stenosis, unspecified region other than cervical    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    Vertigo    Past Surgical History:  Procedure Laterality Date   BACK SURGERY  1980's   BREAST BIOPSY Right    40 years ago- thinks right - benign   CARDIAC CATHETERIZATION  2012   Dr Ruthy Cox intervention   CARPAL TUNNEL RELEASE Left 05/21/2023   Procedure: LEFT OPEN CARPAL TUNNEL RELEASE;  Surgeon: Merrill Abide, MD;  Location: [facility];  Service: Orthopedics;  Laterality: Left;   CHOLECYSTECTOMY     GALLBLADDER SURGERY  1990's   REPAIR EXTENSOR TENDON Left 05/21/2023   Procedure: LEFT WRIST FIRST EXTENSOR COMPARTMENT RELEASE;  Surgeon: Merrill Abide, MD;  Location: [facility];  Service: Orthopedics;   Laterality: Left;   Patient Active Problem List   Diagnosis Date Noted   Carpal tunnel syndrome, left upper limb 05/21/2023   Eddie Good Quervain's disease (radial styloid tenosynovitis) 05/21/2023   Obstructive sleep apnea 08/13/2011   Nonallergic rhinitis 08/13/2011   Mild intermittent asthma 08/13/2011    ONSET DATE: DOS 05/21/23  REFERRING DIAG:  M65.4 (ICD-10-CM) - Eddie Good Quervain's disease (tenosynovitis)  G56.02 (ICD-10-CM) - Carpal tunnel syndrome, left upper limb    THERAPY DIAG:  Muscle weakness (generalized)  Pain in left wrist  Stiffness of left wrist, not elsewhere classified  Localized edema  Other lack of coordination  Rationale for Evaluation and Treatment: Rehabilitation  PERTINENT HISTORY: Past history of DJD, hypertension, cardiac catheterization and some other issues but no diabetes She states having pain ~1 year or so in the wrist and her fingers had been numb for several months before her surgery. She tried injection .   PRECAUTIONS: None  RED FLAGS: None   WEIGHT BEARING RESTRICTIONS: Yes recommended no heavy pushing pulling or gripping for the next 4 to 6 weeks (under 5 to 10 pounds is okay if not painful)    SUBJECTIVE:   SUBJECTIVE STATEMENT: Now  ~10 weeks s/p Lt CTR and deQuervain's releases.  She states  doing laundry and work around around the home and not  having much pain at all.     PAIN:  Are you having pain? Yes: states "just a little" pain in CTR and base of thumb today     PATIENT GOALS: To improve use of left dominant hand and arm and decrease pain  NEXT MD VISIT: 07/12/2023    OBJECTIVE: (All objective assessments below are from initial evaluation on: 06/10/23 unless otherwise specified.)   HAND DOMINANCE: LEFT  ADLs: Overall ADLs: States decreased ability to grab, hold household objects, pain and difficulty to open containers, perform FMS tasks (manipulate fasteners on clothing), mild to moderate bathing problems as well.     FUNCTIONAL OUTCOME MEASURES: 07/21/23: Quick DASH: 27% impairment now    Eval: Quick DASH 79.5% impairment today  (Higher % Score  =  More Impairment)     UPPER EXTREMITY ROM     Shoulder to Wrist AROM LEFT eval Lt 06/15/23 Lt 06/30/23 Lt 07/07/23 Lt 07/14/23 Lt 07/21/23 Lt 08/04/23  Forearm supination 88        Forearm pronation  70        Wrist flexion 21 15 31  44 35 60 68  Wrist extension 26 46 52 50 40 59 45  Wrist ulnar deviation 19     45   Wrist radial deviation 15     20   (Blank rows = not tested)   Hand AROM LEFT eval Lt 06/30/23 Lt 07/14/23  Full Fist Ability (or Gap to Distal Palmar Crease) Full fist but stiff  Full fist  Thumb Opposition  (Kapandji Scale)  6/10 7/10 8/10  (Blank rows = not tested)   UPPER EXTREMITY MMT:     MMT Left 07/21/23  Shoulder flexion   Shoulder abduction   Shoulder adduction   Shoulder extension   Shoulder internal rotation   Shoulder external rotation   Middle trapezius   Lower trapezius   Elbow flexion 4/5 (stiff in sh/neck)  Elbow extension 4+/5  Forearm supination   Forearm pronation   Wrist flexion 4-/5  Wrist extension 4+/5  Wrist ulnar deviation   Wrist radial deviation   (Blank rows = not tested)  HAND FUNCTION: 08/04/23 Grip Lt: 31#; 26# on Right  (WNL now)    07/21/23: Grip Lt: 29.3#   07/14/23: Lt grip: 21.7#   07/07/23: Grip Lt: 19.4#   06/30/23:  Grip strength Right: 35.7lbs, Left: 15 lbs   Eval: Observed weakness in affected left hand as seen by stiffness and poor motion, details will be tested in future sessions when safe.    COORDINATION: 07/21/23: 9HPT Rt: 25sec (31sec is WFL)    07/07/23: 9 Hole Peg Test Left: 43.8 sec (31 sec is WFL)    SENSATION: Eval:  Light touch diminished around sx area and in the tips of digits 1 through 3 of the left hand.  She states this is not significantly better since surgery yet.  She describes some hypersensitivity around her surgical sites  EDEMA:   Eval:   Mildly swollen in left hand and wrist today  OBSERVATIONS:   Eval: Surgical sites are healing up well, no signs of infection, have some hypersensitivity to touch, no dehiscence, overt stiffness and some anxiousness is present when trying to move her hand and arm.   Lt CTR and deQuervain's releases   TODAY'S TREATMENT:  08/04/23: AROM shows improvements. We continue dynamic and fnl strengthening as listed below.  Additionally she was given red therapy band and shown how to perform dynamic strengthening of the tricep  and bicep muscles to add to her home exercise program.  She tolerates all these things well today, states she has improving function, and OT asks her to return to all of her normal duties and tasks at home to be discussed in the next session.  OT does inform her that it will likely be a discharge if all of her goals are met.   UBE x 5 mins 2.5 resistance at least 45 RPM   Yellow flexbar pronation x10   Yellow Flexbar Supination x10   Yellow flexbar wrist flex/ext x10     Exercises added to HEP today:  - Standing Elbow Extension with Self-Anchored Resistance  - 4-6 x daily - 1 sets - 10-15 reps - Standing Bicep Curls with Resistance  - 2-4 x daily - 1-2 sets - 10-15 reps     07/28/23: To try to encourage the use of her hand, OT pushes her a bit today to do dynamic functional activities and strengthening as listed below.  We start with moist heat while OT explains and reviews her home exercises concurrently.  She then uses the UB E for functional push and pull followed by flex bar activities as well as Velcro board activities, therapy putty activities to simulate cooking tasks and other home tasks as listed below.  At the end of the session she states these things were difficult for her and mildly painful, though she feels that she has more confidence and can do more things at home than she thought.   UBE x 4.5 mins 2.0 resistance at least 40 RPM   Yellow flexbar pronation  x10   Yellow Flexbar Supination x10   Yellow flexbar wrist flex/ext x10  Velcro Board for turning can opener and using mixing spoon  Cutting pink therapy putty  Mashing potatoes fnl activity     PATIENT EDUCATION: Education details: See tx section above for details  Person educated: Patient Education method: Engineer, structural, Teach back, Handouts  Education comprehension: States and demonstrates understanding, Additional Education required    HOME EXERCISE PROGRAM: Access Code: [email or web address] URL: [email or web address] Date: 06/10/2023 Prepared by: Leartis Proud     GOALS: Goals reviewed with patient? Yes   SHORT TERM GOALS: (STG required if POC>30 days) Target Date: 06/25/2023  Pt will obtain protective, custom orthotic. Goal status: TBD/PRN  2.  Pt will demo/state understanding of initial HEP to improve pain levels and prerequisite motion. Goal status: 06/30/23: Met   LONG TERM GOALS: Target Date: 08/13/2023  Pt will improve functional ability by decreased impairment per Quick DASH assessment from 79.5% to 30% or better, for better quality of life. Goal status:07/21/23: MET  2.  Pt will improve grip strength in left hand to at least 25 lbs for functional use at home and in IADLs. Goal status:07/21/23: MET  3.  Pt will improve A/ROM in left wrist flexion/extension from 21/26 degrees respectively to at least 45 degrees each, to have functional motion for tasks like reach and grasp.  Goal status:07/21/23: MET  4.  Pt will improve strength in left wrist flexion/extension from apparent 3 -/5 MMT to at least 4/5 MMT to have increased functional ability to carry out selfcare and higher-level homecare tasks with less difficulty. Goal status: 07/21/23: Partially MET- still a bit weak in wrist flexion  5.  Pt will improve coordination skills in left dominant hand, as seen by within functional limit score on nine-hole peg testing to have increased functional ability  to carry out fine motor tasks (fasteners,  etc.) and more complex, coordinated IADLs (meal prep, sports, etc.).  Goal status:07/21/23: MET  6.  Pt will decrease pain at worst from 7-8/10 to 3/10 or better to have better sleep and occupational participation in daily roles. Goal status: 07/21/23: improved to 6/10 but not met    ASSESSMENT:  CLINICAL IMPRESSION: 08/04/23: She is doing well, having less pain and more functional ability, and likely we will discharge after the next visit.  07/28/23: Pushing her a bit with functional activities was beneficial for her today and she performed better than she anticipated.  She has a lot of fear and apprehension, and OT will help her get over this in the next 1-2 more sessions hopefully  07/21/23: She is now meant four of her 6 long-term goals, is doing much better, however she does have some weakness and pain with wrist flexion and some problems with her functional activities still.  She would like to continue on therapy for 2-3 more weeks to build her strength, decrease her pain and get her more functional.   PLAN:  OT FREQUENCY: 1x/week  OT DURATION: 3 additional weeks from 07/23/2023 - 08/13/23 and up to 9 total visits as needed  PLANNED INTERVENTIONS: 97168 OT Re-evaluation, 97535 self care/ADL training, 64403 therapeutic exercise, 97530 therapeutic activity, 97112 neuromuscular re-education, 97140 manual therapy, 97035 ultrasound, 97039 fluidotherapy, 97010 moist heat, 97010 cryotherapy, 97760 Orthotics management and training, 47425 Subsequent splinting/medication, scar mobilization, passive range of motion, compression bandaging, Dry needling, coping strategies training, patient/family education, and DME and/or AE instructions  RECOMMENDED OTHER SERVICES: None now  CONSULTED AND AGREED WITH PLAN OF CARE: Patient  PLAN FOR NEXT SESSION:   Planned discharge after progress note and ensuring that she is doing well and has no further complaints and  meets all goals   Leartis Proud, OTR/L, CHT 08/04/2023, 5:20 PM

## 2023-08-10 NOTE — Therapy (Signed)
 OUTPATIENT OCCUPATIONAL THERAPY TREATMENT & DISCHARGE NOTE   Patient Name: Charlotte Whitehead MRN: [id number] DOB:16-Oct-1940, 83 y.o., female Today's Date: 08/11/2023  PCP: Eliberto Grosser MD REFERRING PROVIDER: Merrill Abide, MD             OCCUPATIONAL THERAPY DISCHARGE SUMMARY  Visits from Start of Care: 9  Current functional level related to goals / functional outcomes: 08/11/23: She is now met all of her long-term goals except for still having some infrequent episodes of pain in the first dorsal compartment release area a couple of times over the past week.  Other than that, she is doing very well, much better, states she is able to bathe herself and do activities that she was completely unable to before her surgery.  She agrees to discharge therapy today.   Education / Equipment: Pt has all needed materials and education. Pt understands how to continue on with self-management. See tx notes for more details.   Patient agrees to discharge due to max benefits received from outpatient occupational therapy / hand therapy at this time.   Leartis Proud, OTR/L, CHT 08/11/23         END OF SESSION:  OT End of Session - 08/11/23 1523     Visit Number 9    Number of Visits 10    Date for OT Re-Evaluation 08/13/23    Authorization Type [employer]    OT Start Time 1523    OT Stop Time 1555    OT Time Calculation (min) 32 min    Equipment Utilized During Treatment --    Activity Tolerance Patient tolerated treatment well;No increased pain;Patient limited by fatigue    Behavior During Therapy [facility] for tasks assessed/performed                Past Medical History:  Diagnosis Date   Abdominal pain, unspecified site    Allergic rhinitis due to pollen    Anxiety    DJD (degenerative joint disease)    Enthesopathy of unspecified site    Essential hypertension, malignant    Extrinsic asthma, unspecified    Fecal incontinence    GERD (gastroesophageal reflux disease)     Headache(784.0)    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    Other bursitis disorders    Sleep apnea    no CPAP   Spinal stenosis, unspecified region other than cervical    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    Vertigo    Past Surgical History:  Procedure Laterality Date   BACK SURGERY  1980's   BREAST BIOPSY Right    40 years ago- thinks right - benign   CARDIAC CATHETERIZATION  2012   Dr Ruthy Cox intervention   CARPAL TUNNEL RELEASE Left 05/21/2023   Procedure: LEFT OPEN CARPAL TUNNEL RELEASE;  Surgeon: Merrill Abide, MD;  Location: [facility];  Service: Orthopedics;  Laterality: Left;   CHOLECYSTECTOMY     GALLBLADDER SURGERY  1990's   REPAIR EXTENSOR TENDON Left 05/21/2023   Procedure: LEFT WRIST FIRST EXTENSOR COMPARTMENT RELEASE;  Surgeon: Merrill Abide, MD;  Location: [facility];  Service: Orthopedics;  Laterality: Left;   Patient Active Problem List   Diagnosis Date Noted   Carpal tunnel syndrome, left upper limb 05/21/2023   De Quervain's disease (radial styloid tenosynovitis) 05/21/2023   Obstructive sleep apnea 08/13/2011   Nonallergic rhinitis 08/13/2011   Mild intermittent asthma 08/13/2011    ONSET DATE: DOS 05/21/23  REFERRING DIAG:  M65.4 (ICD-10-CM) - Dewaine Footman  disease (tenosynovitis)  G56.02 (ICD-10-CM) - Carpal tunnel syndrome, left upper limb    THERAPY DIAG:  Muscle weakness (generalized)  Pain in left wrist  Stiffness of left wrist, not elsewhere classified  Localized edema  Other lack of coordination  Rationale for Evaluation and Treatment: Rehabilitation  PERTINENT HISTORY: Past history of DJD, hypertension, cardiac catheterization and some other issues but no diabetes She states having pain ~1 year or so in the wrist and her fingers had been numb for several months before her surgery. She tried injection .   PRECAUTIONS: None  RED FLAGS: None   WEIGHT BEARING RESTRICTIONS: Yes  recommended no heavy pushing pulling or gripping for the next 4 to 6 weeks (under 5 to 10 pounds is okay if not painful)    SUBJECTIVE:   SUBJECTIVE STATEMENT: Now  10+ weeks s/p Lt CTR and deQuervain's releases.  She states she has been doing more cooking and cleaning and having less pain and problems, though sometimes the surgical area by the base of her thumb is still a bit sensitive, weak, painful.  She does state that her stretches help.     PAIN:  Are you having pain? Yes:   states "just a little" pain in CTR and base of thumb today, up to 5/10 in the past week    PATIENT GOALS: To improve use of left dominant hand and arm and decrease pain  NEXT MD VISIT: 07/12/2023    OBJECTIVE: (All objective assessments below are from initial evaluation on: 06/10/23 unless otherwise specified.)   HAND DOMINANCE: LEFT  ADLs: Overall ADLs: States decreased ability to grab, hold household objects, pain and difficulty to open containers, perform FMS tasks (manipulate fasteners on clothing), mild to moderate bathing problems as well.    FUNCTIONAL OUTCOME MEASURES: 08/11/23: Quick DASH: 36% today    07/21/23: Quick DASH: 27% impairment now    Eval: Quick DASH 79.5% impairment today  (Higher % Score  =  More Impairment)     UPPER EXTREMITY ROM     Shoulder to Wrist AROM LEFT eval Lt 06/15/23 Lt 06/30/23 Lt 07/07/23 Lt 07/14/23 Lt 07/21/23 Lt 08/04/23 Lt 08/11/23  Forearm supination 88       80  Forearm pronation  70       70  Wrist flexion 21 15 31  44 35 60 68 41  Wrist extension 26 46 52 50 40 59 45 55  Wrist ulnar deviation 19     45    Wrist radial deviation 15     20    (Blank rows = not tested)   Hand AROM LEFT eval Lt 06/30/23 Lt 07/14/23 Lt 08/11/23  Full Fist Ability (or Gap to Distal Palmar Crease) Full fist but stiff  Full fist full  Thumb Opposition  (Kapandji Scale)  6/10 7/10 8/10 8/10  (Blank rows = not tested)   UPPER EXTREMITY MMT:     MMT Left 07/21/23  Lt 08/11/23  Shoulder flexion    Shoulder abduction    Shoulder adduction    Shoulder extension    Shoulder internal rotation    Shoulder external rotation    Middle trapezius    Lower trapezius    Elbow flexion 4/5 (stiff in sh/neck) 4+/5  Elbow extension 4+/5 5/5  Forearm supination  4+/5  Forearm pronation  5/5  Wrist flexion 4-/5 4+/5  Wrist extension 4+/5 5/5  Wrist ulnar deviation    Wrist radial deviation    (Blank rows = not tested)  HAND FUNCTION: 08/04/23 Grip Lt: 31#; 26# on Right  (WNL now)    07/21/23: Grip Lt: 29.3#   07/14/23: Lt grip: 21.7#   07/07/23: Grip Lt: 19.4#   06/30/23:  Grip strength Right: 35.7lbs, Left: 15 lbs   Eval: Observed weakness in affected left hand as seen by stiffness and poor motion, details will be tested in future sessions when safe.    COORDINATION: 07/21/23: 9HPT Rt: 25sec (31sec is WFL)    07/07/23: 9 Hole Peg Test Left: 43.8 sec (31 sec is WFL)    SENSATION: Eval:  Light touch diminished around sx area and in the tips of digits 1 through 3 of the left hand.  She states this is not significantly better since surgery yet.  She describes some hypersensitivity around her surgical sites  EDEMA:   Eval:  Mildly swollen in left hand and wrist today  OBSERVATIONS:   Eval: Surgical sites are healing up well, no signs of infection, have some hypersensitivity to touch, no dehiscence, overt stiffness and some anxiousness is present when trying to move her hand and arm.   Lt CTR and deQuervain's releases   TODAY'S TREATMENT:  08/11/23:  Pt performs AROM, gripping, and strength with left hand/wrist/arm against therapist's resistance for exercise/activities as well as new measures today. OT also discusses home and functional tasks with the pt and although she rates her ability somewhat worse today on the QuickDASH, she verbalizes doing better than a month ago and much better than presurgery.  (She has had some difficulty and confusion using pain  number rating scales in the past). OT also reviews all of her home exercises and provides an additional printed copy.  Each of these were reviewed with her and she states understanding that she should continue to stretch every day for 2 weeks and strengthen at least every other day to continue to build up her ability.  She should use caution with knives and sharp objects in the careful for her balance issues which are apparently not very good.  She states that she still will have some family support and that she feels comfortable discharging today and continuing to perform exercises at home.  She is aware of her follow-up on July 1.    Exercises - Wrist Flexion Stretch  - 3-4 x daily - 5 reps - 15 sec hold - Wrist Prayer Stretch  - 3-4 x daily - 5 reps - 15 sec hold - Seated Composite Thumb Flexion PROM  - 2-3 x daily - 3-5 reps - 15 sec hold - Full Fist  - 2-3 x daily - 5 reps - Thumb Opposition with Putty  - 2-3 x daily - 5 reps - Standing Elbow Extension with Self-Anchored Resistance  - 4-6 x daily - 1 sets - 10-15 reps - Standing Bicep Curls with Resistance  - 2-4 x daily - 1-2 sets - 10-15 reps     PATIENT EDUCATION: Education details: See tx section above for details  Person educated: Patient Education method: Verbal Instruction, Teach back, Handouts  Education comprehension: States and demonstrates understanding HOME EXERCISE PROGRAM: Access Code: [email or web address] URL: [email or web address] Date: 06/10/2023 Prepared by: Leartis Proud     GOALS: Goals reviewed with patient? Yes   SHORT TERM GOALS: (STG required if POC>30 days) Target Date: 06/25/2023  Pt will obtain protective, custom orthotic. Goal status: 08/11/23:  D/C   2.  Pt will demo/state understanding of initial HEP to improve pain levels and prerequisite motion. Goal status: 06/30/23: Met  LONG TERM GOALS: Target Date: 08/13/2023  Pt will improve functional ability by decreased impairment per Quick DASH  assessment from 79.5% to 30% or better, for better quality of life. Goal status:07/21/23: MET (08/11/2023: She rates her ability worse today, though verbally states doing better than before.  OT will assume that this is just some confusion with this rating system)  2.  Pt will improve grip strength in left hand to at least 25 lbs for functional use at home and in IADLs. Goal status:07/21/23: MET  3.  Pt will improve A/ROM in left wrist flexion/extension from 21/26 degrees respectively to at least 45 degrees each, to have functional motion for tasks like reach and grasp.  Goal status:07/21/23: MET  4.  Pt will improve strength in left wrist flexion/extension from apparent 3 -/5 MMT to at least 4/5 MMT to have increased functional ability to carry out selfcare and higher-level homecare tasks with less difficulty. Goal status: 08/11/23: Goal met   5.  Pt will improve coordination skills in left dominant hand, as seen by within functional limit score on nine-hole peg testing to have increased functional ability to carry out fine motor tasks (fasteners, etc.) and more complex, coordinated IADLs (meal prep, sports, etc.).  Goal status:07/21/23: MET  6.  Pt will decrease pain at worst from 7-8/10 to 3/10 or better to have better sleep and occupational participation in daily roles. Goal status: 08/11/23: Not met, she still states up to 5/10 pain but infrequently    ASSESSMENT:  CLINICAL IMPRESSION: 08/11/23: She is now met all of her long-term goals except for still having some infrequent episodes of pain in the first dorsal compartment release area a couple of times over the past week.  Other than that, she is doing very well, much better, states she is able to bathe herself and do activities that she was completely unable to before her surgery.  She agrees to discharge therapy today.     PLAN:  OT FREQUENCY:  D/C  OT DURATION: D/C  PLANNED INTERVENTIONS: 97168 OT Re-evaluation, 97535 self care/ADL  training, 78295 therapeutic exercise, 97530 therapeutic activity, 97112 neuromuscular re-education, 97140 manual therapy, 97035 ultrasound, 97039 fluidotherapy, 97010 moist heat, 97010 cryotherapy, 97760 Orthotics management and training, 62130 Subsequent splinting/medication, scar mobilization, passive range of motion, compression bandaging, Dry needling, coping strategies training, patient/family education, and DME and/or AE instructions  RECOMMENDED OTHER SERVICES: None now  CONSULTED AND AGREED WITH PLAN OF CARE: Patient  PLAN FOR NEXT SESSION:   N/A/discharge   Leartis Proud, OTR/L, CHT 08/11/2023, 4:01 PM

## 2023-08-11 ENCOUNTER — Ambulatory Visit: Admitting: Rehabilitative and Restorative Service Providers"

## 2023-08-11 ENCOUNTER — Encounter: Payer: Self-pay | Admitting: Rehabilitative and Restorative Service Providers"

## 2023-08-11 DIAGNOSIS — M25532 Pain in left wrist: Secondary | ICD-10-CM | POA: Diagnosis not present

## 2023-08-11 DIAGNOSIS — M25632 Stiffness of left wrist, not elsewhere classified: Secondary | ICD-10-CM | POA: Diagnosis not present

## 2023-08-11 DIAGNOSIS — R278 Other lack of coordination: Secondary | ICD-10-CM

## 2023-08-11 DIAGNOSIS — M6281 Muscle weakness (generalized): Secondary | ICD-10-CM

## 2023-08-11 DIAGNOSIS — R600 Localized edema: Secondary | ICD-10-CM

## 2023-08-13 ENCOUNTER — Other Ambulatory Visit (HOSPITAL_COMMUNITY): Payer: Self-pay

## 2023-08-13 MED ORDER — MAGNESIUM CITRATE PO SOLN
ORAL | 1 refills | Status: AC
  Filled 2023-08-13 (×2): qty 296, 1d supply, fill #0

## 2023-08-13 MED ORDER — DOXYCYCLINE HYCLATE 100 MG PO TABS
100.0000 mg | ORAL_TABLET | Freq: Two times a day (BID) | ORAL | 0 refills | Status: AC
  Filled 2023-08-13: qty 20, 10d supply, fill #0

## 2023-09-07 ENCOUNTER — Ambulatory Visit: Admitting: Orthopedic Surgery

## 2023-09-14 ENCOUNTER — Other Ambulatory Visit (HOSPITAL_COMMUNITY): Payer: Self-pay

## 2023-09-14 MED ORDER — HYDROCODONE-ACETAMINOPHEN 5-325 MG PO TABS
1.0000 | ORAL_TABLET | Freq: Four times a day (QID) | ORAL | 0 refills | Status: DC | PRN
  Filled 2023-09-14: qty 120, 30d supply, fill #0

## 2023-09-15 ENCOUNTER — Ambulatory Visit: Admitting: Orthopedic Surgery

## 2023-09-15 ENCOUNTER — Other Ambulatory Visit (HOSPITAL_COMMUNITY): Payer: Self-pay

## 2023-09-15 DIAGNOSIS — G5602 Carpal tunnel syndrome, left upper limb: Secondary | ICD-10-CM | POA: Diagnosis not present

## 2023-09-15 DIAGNOSIS — M654 Radial styloid tenosynovitis [de Quervain]: Secondary | ICD-10-CM

## 2023-09-15 NOTE — Progress Notes (Signed)
   Charlotte Whitehead - 83 y.o. female MRN [id number]  Date of birth: 09-Mar-1941  Office Visit Note: Visit Date: 09/15/2023 PCP: Addie Camellia LITTIE, MD Referred by: Addie Camellia LITTIE, MD  Subjective:  HPI: Charlotte Whitehead is a 83 y.o. female who presents today for follow up 4 months post left wrist first extensor compartment release and open carpal tunnel release.  She is doing well overall, numbness and tingling has improved drastically.  Has resumed activity as tolerated.  Pertinent ROS were reviewed with the patient and found to be negative unless otherwise specified above in HPI.   Assessment & Plan: Visit Diagnoses:  1. De Quervain's disease (radial styloid tenosynovitis)   2. Carpal tunnel syndrome, left upper limb     Plan: She has done very well postoperatively.  She is very pleased with her outcome and can continue with activities as tolerated moving forward without restriction.  Numbness and tingling continues to improve which is excellent.  No residual pain at the radial aspect of the wrist.   Follow-up: No follow-ups on file.   Meds & Orders: No orders of the defined types were placed in this encounter.  No orders of the defined types were placed in this encounter.    Procedures: No procedures performed       Objective:   Vital Signs: There were no vitals taken for this visit.  Ortho Exam Left hand: - Well-healed palmar incision, well-healed dorsal radial incision over the wrist - Composite fist without restriction, thumb opposition to the small finger [facility] - Sensation intact to light touch in the median nerve distribution, slightly diminished at the distal aspect of the index finger - 4/5 APB mild thenar atrophy    Imaging: No results found.   Hondo Nanda Afton Alderton, M.D. [facility], Hand Surgery

## 2023-10-26 ENCOUNTER — Other Ambulatory Visit (HOSPITAL_COMMUNITY): Payer: Self-pay

## 2023-10-26 MED ORDER — HYDROCODONE-ACETAMINOPHEN 5-325 MG PO TABS
1.0000 | ORAL_TABLET | Freq: Four times a day (QID) | ORAL | 0 refills | Status: DC | PRN
  Filled 2023-10-26: qty 120, 30d supply, fill #0

## 2023-10-28 ENCOUNTER — Other Ambulatory Visit (HOSPITAL_COMMUNITY): Payer: Self-pay

## 2023-11-02 ENCOUNTER — Other Ambulatory Visit (HOSPITAL_COMMUNITY): Payer: Self-pay

## 2023-11-02 MED ORDER — ALENDRONATE SODIUM 70 MG PO TABS
70.0000 mg | ORAL_TABLET | ORAL | 4 refills | Status: AC
  Filled 2023-11-02 – 2023-12-01 (×2): qty 4, 28d supply, fill #0

## 2023-11-02 MED ORDER — AZITHROMYCIN 250 MG PO TABS
ORAL_TABLET | ORAL | 0 refills | Status: AC
  Filled 2023-11-02: qty 6, 5d supply, fill #0

## 2023-12-01 ENCOUNTER — Other Ambulatory Visit (HOSPITAL_COMMUNITY): Payer: Self-pay

## 2023-12-01 ENCOUNTER — Other Ambulatory Visit: Payer: Self-pay

## 2023-12-06 ENCOUNTER — Other Ambulatory Visit (HOSPITAL_COMMUNITY): Payer: Self-pay

## 2023-12-06 MED ORDER — POTASSIUM CHLORIDE CRYS ER 20 MEQ PO TBCR
20.0000 meq | EXTENDED_RELEASE_TABLET | Freq: Two times a day (BID) | ORAL | 6 refills | Status: AC
  Filled 2023-12-06: qty 60, 30d supply, fill #0
  Filled 2024-03-20: qty 60, 30d supply, fill #1
  Filled 2024-04-11: qty 60, 30d supply, fill #0

## 2023-12-06 MED ORDER — HYDROCODONE-ACETAMINOPHEN 5-325 MG PO TABS
1.0000 | ORAL_TABLET | Freq: Four times a day (QID) | ORAL | 0 refills | Status: DC | PRN
  Filled 2023-12-06: qty 120, 30d supply, fill #0

## 2023-12-08 ENCOUNTER — Other Ambulatory Visit (HOSPITAL_COMMUNITY): Payer: Self-pay

## 2023-12-09 ENCOUNTER — Other Ambulatory Visit: Payer: Self-pay

## 2023-12-09 ENCOUNTER — Other Ambulatory Visit (HOSPITAL_COMMUNITY): Payer: Self-pay

## 2023-12-14 ENCOUNTER — Other Ambulatory Visit (HOSPITAL_COMMUNITY): Payer: Self-pay

## 2024-01-25 ENCOUNTER — Other Ambulatory Visit (HOSPITAL_COMMUNITY): Payer: Self-pay

## 2024-01-25 MED ORDER — HYDROCODONE-ACETAMINOPHEN 5-325 MG PO TABS
1.0000 | ORAL_TABLET | Freq: Four times a day (QID) | ORAL | 0 refills | Status: DC | PRN
  Filled 2024-01-25: qty 120, 30d supply, fill #0

## 2024-03-06 ENCOUNTER — Other Ambulatory Visit (HOSPITAL_COMMUNITY): Payer: Self-pay

## 2024-03-06 MED ORDER — HYDROCODONE-ACETAMINOPHEN 5-325 MG PO TABS
1.0000 | ORAL_TABLET | Freq: Four times a day (QID) | ORAL | 0 refills | Status: DC | PRN
  Filled 2024-03-06: qty 120, 30d supply, fill #0

## 2024-03-13 ENCOUNTER — Other Ambulatory Visit: Payer: Self-pay

## 2024-03-20 ENCOUNTER — Other Ambulatory Visit (HOSPITAL_COMMUNITY): Payer: Self-pay

## 2024-03-21 ENCOUNTER — Other Ambulatory Visit (HOSPITAL_COMMUNITY): Payer: Self-pay

## 2024-03-21 ENCOUNTER — Other Ambulatory Visit: Payer: Self-pay

## 2024-03-24 ENCOUNTER — Other Ambulatory Visit: Payer: Self-pay

## 2024-04-11 ENCOUNTER — Other Ambulatory Visit: Payer: Self-pay

## 2024-04-11 ENCOUNTER — Other Ambulatory Visit (HOSPITAL_COMMUNITY): Payer: Self-pay

## 2024-04-11 MED ORDER — HYDROCODONE-ACETAMINOPHEN 5-325 MG PO TABS
1.0000 | ORAL_TABLET | Freq: Four times a day (QID) | ORAL | 0 refills | Status: AC | PRN
  Filled 2024-04-11: qty 120, 30d supply, fill #0
